# Patient Record
Sex: Female | Born: 1970 | Race: White | Hispanic: No | Marital: Single | Smoking: Never smoker
Health system: Southern US, Community
[De-identification: ages and names within clinical notes are randomized; demographics above are authoritative.]

## PROBLEM LIST (undated history)

## (undated) DIAGNOSIS — N3010 Interstitial cystitis (chronic) without hematuria: Secondary | ICD-10-CM

## (undated) DIAGNOSIS — F32A Depression, unspecified: Secondary | ICD-10-CM

## (undated) DIAGNOSIS — G8929 Other chronic pain: Secondary | ICD-10-CM

## (undated) DIAGNOSIS — C50212 Malignant neoplasm of upper-inner quadrant of left female breast: Principal | ICD-10-CM

## (undated) DIAGNOSIS — K594 Anal spasm: Secondary | ICD-10-CM

## (undated) DIAGNOSIS — M25512 Pain in left shoulder: Secondary | ICD-10-CM

## (undated) DIAGNOSIS — F329 Major depressive disorder, single episode, unspecified: Secondary | ICD-10-CM

## (undated) DIAGNOSIS — K649 Unspecified hemorrhoids: Secondary | ICD-10-CM

## (undated) DIAGNOSIS — Z803 Family history of malignant neoplasm of breast: Secondary | ICD-10-CM

## (undated) DIAGNOSIS — D649 Anemia, unspecified: Secondary | ICD-10-CM

## (undated) DIAGNOSIS — K5900 Constipation, unspecified: Secondary | ICD-10-CM

## (undated) DIAGNOSIS — K589 Irritable bowel syndrome without diarrhea: Secondary | ICD-10-CM

## (undated) DIAGNOSIS — F419 Anxiety disorder, unspecified: Secondary | ICD-10-CM

## (undated) DIAGNOSIS — G709 Myoneural disorder, unspecified: Secondary | ICD-10-CM

## (undated) HISTORY — PX: TONSILLECTOMY: SUR1361

## (undated) HISTORY — DX: Interstitial cystitis (chronic) without hematuria: N30.10

## (undated) HISTORY — PX: WISDOM TOOTH EXTRACTION: SHX21

## (undated) HISTORY — DX: Anxiety disorder, unspecified: F41.9

## (undated) HISTORY — DX: Irritable bowel syndrome, unspecified: K58.9

## (undated) HISTORY — DX: Unspecified hemorrhoids: K64.9

## (undated) HISTORY — DX: Constipation, unspecified: K59.00

## (undated) HISTORY — DX: Family history of malignant neoplasm of breast: Z80.3

## (undated) HISTORY — DX: Anal spasm: K59.4

## (undated) HISTORY — DX: Malignant neoplasm of upper-inner quadrant of left female breast: C50.212

---

## 1999-05-29 ENCOUNTER — Inpatient Hospital Stay (HOSPITAL_COMMUNITY): Admission: AD | Admit: 1999-05-29 | Discharge: 1999-05-29 | Payer: Self-pay | Admitting: Obstetrics & Gynecology

## 1999-10-14 ENCOUNTER — Inpatient Hospital Stay (HOSPITAL_COMMUNITY): Admission: AD | Admit: 1999-10-14 | Discharge: 1999-10-15 | Payer: Self-pay | Admitting: *Deleted

## 1999-10-16 ENCOUNTER — Encounter: Admission: RE | Admit: 1999-10-16 | Discharge: 1999-11-18 | Payer: Self-pay | Admitting: Obstetrics and Gynecology

## 2000-08-22 ENCOUNTER — Encounter: Admission: RE | Admit: 2000-08-22 | Discharge: 2000-09-18 | Payer: Self-pay | Admitting: Obstetrics and Gynecology

## 2002-04-30 ENCOUNTER — Other Ambulatory Visit: Admission: RE | Admit: 2002-04-30 | Discharge: 2002-04-30 | Payer: Self-pay | Admitting: Obstetrics and Gynecology

## 2003-08-11 ENCOUNTER — Other Ambulatory Visit: Admission: RE | Admit: 2003-08-11 | Discharge: 2003-08-11 | Payer: Self-pay | Admitting: Obstetrics and Gynecology

## 2004-01-21 ENCOUNTER — Emergency Department (HOSPITAL_COMMUNITY): Admission: EM | Admit: 2004-01-21 | Discharge: 2004-01-21 | Payer: Self-pay | Admitting: Family Medicine

## 2005-08-30 ENCOUNTER — Other Ambulatory Visit: Admission: RE | Admit: 2005-08-30 | Discharge: 2005-08-30 | Payer: Self-pay | Admitting: Obstetrics and Gynecology

## 2005-09-14 ENCOUNTER — Encounter: Admission: RE | Admit: 2005-09-14 | Discharge: 2005-09-14 | Payer: Self-pay | Admitting: Obstetrics and Gynecology

## 2012-08-10 ENCOUNTER — Ambulatory Visit (INDEPENDENT_AMBULATORY_CARE_PROVIDER_SITE_OTHER): Payer: BC Managed Care – PPO | Admitting: Family Medicine

## 2012-08-10 VITALS — BP 80/64 | HR 77 | Temp 98.3°F | Resp 18 | Wt 133.0 lb

## 2012-08-10 DIAGNOSIS — N390 Urinary tract infection, site not specified: Secondary | ICD-10-CM

## 2012-08-10 LAB — POCT URINALYSIS DIPSTICK
Bilirubin, UA: NEGATIVE
Ketones, UA: NEGATIVE
Nitrite, UA: NEGATIVE
Protein, UA: NEGATIVE

## 2012-08-10 LAB — POCT UA - MICROSCOPIC ONLY
Mucus, UA: NEGATIVE
Yeast, UA: NEGATIVE

## 2012-08-10 MED ORDER — NITROFURANTOIN MONOHYD MACRO 100 MG PO CAPS: 100.0000 mg | ORAL_CAPSULE | Freq: Two times a day (BID) | ORAL | Status: DC

## 2012-08-10 NOTE — Patient Instructions (Signed)
To look up more info on your condition, go to the website [email or web address], then on patient resources - select UPTODATE. Under patient resources, select cystitis or bladder infection.   Urinary Tract Infection Urinary tract infections (UTIs) can develop anywhere along your urinary tract. Your urinary tract is your body's drainage system for removing wastes and extra water. Your urinary tract includes two kidneys, two ureters, a bladder, and a urethra. Your kidneys are a pair of bean-shaped organs. Each kidney is about the size of your fist. They are located below your ribs, one on each side of your spine. CAUSES Infections are caused by microbes, which are microscopic organisms, including fungi, viruses, and bacteria. These organisms are so small that they can only be seen through a microscope. Bacteria are the microbes that most commonly cause UTIs. SYMPTOMS  Symptoms of UTIs may vary by age and gender of the patient and by the location of the infection. Symptoms in young women typically include a frequent and intense urge to urinate and a painful, burning feeling in the bladder or urethra during urination. Older women and men are more likely to be tired, shaky, and weak and have muscle aches and abdominal pain. A fever may mean the infection is in your kidneys. Other symptoms of a kidney infection include pain in your back or sides below the ribs, nausea, and vomiting. DIAGNOSIS To diagnose a UTI, your caregiver will ask you about your symptoms. Your caregiver also will ask to provide a urine sample. The urine sample will be tested for bacteria and white blood cells. White blood cells are made by your body to help fight infection. TREATMENT  Typically, UTIs can be treated with medication. Because most UTIs are caused by a bacterial infection, they usually can be treated with the use of antibiotics. The choice of antibiotic and length of treatment depend on your symptoms and the type of bacteria causing  your infection. HOME CARE INSTRUCTIONS  If you were prescribed antibiotics, take them exactly as your caregiver instructs you. Finish the medication even if you feel better after you have only taken some of the medication.  Drink enough water and fluids to keep your urine clear or pale yellow.  Avoid caffeine, tea, and carbonated beverages. They tend to irritate your bladder.  Empty your bladder often. Avoid holding urine for long periods of time.  Empty your bladder before and after sexual intercourse.  After a bowel movement, women should cleanse from front to back. Use each tissue only once. SEEK MEDICAL CARE IF:   You have back pain.  You develop a fever.  Your symptoms do not begin to resolve within 3 days. SEEK IMMEDIATE MEDICAL CARE IF:   You have severe back pain or lower abdominal pain.  You develop chills.  You have nausea or vomiting.  You have continued burning or discomfort with urination. MAKE SURE YOU:   Understand these instructions.  Will watch your condition.  Will get help right away if you are not doing well or get worse. Document Released: 03/22/2005 Document Revised: 12/12/2011 Document Reviewed: 07/21/2011 [facility] Patient Information 2013 [place], [place].

## 2012-08-10 NOTE — Progress Notes (Signed)
Subjective:    Patient ID: Gabriela [name], female    DOB: August 26, 1970, 42 y.o.   MRN: [id number]  HPI Gabriela [name] is a 42 y.o. female Started last night with dysuria.   Home test showed infection. Tried baking soda remedy online - 1/2  teaspoon in 8 ounce of water.   No fever, no N/V, no back pain.  Slight bladder discomfort.  Last UTI years ago.   LNMP: 1 month ago.   Review of Systems  Constitutional: Negative for fever and chills.  Genitourinary: Positive for dysuria, urgency, frequency and difficulty urinating. Negative for hematuria and flank pain.       Objective:   Physical Exam  Vitals reviewed. Constitutional: She is oriented to person, place, and time. She appears well-developed and well-nourished.  HENT:  Head: Normocephalic and atraumatic.  Pulmonary/Chest: Effort normal.  Abdominal: Soft. Normal appearance. She exhibits no distension. There is tenderness in the suprapubic area. There is no rebound, no guarding and no CVA tenderness.  Neurological: She is alert and oriented to person, place, and time.  Skin: Skin is warm.  Psychiatric: She has a normal mood and affect. Her behavior is normal.   Results for orders placed in visit on 08/10/12  POCT UA - MICROSCOPIC ONLY      Result Value Range   WBC, Ur, HPF, POC tntc     RBC, urine, microscopic 1-4     Bacteria, U Microscopic 1+     Mucus, UA neg     Epithelial cells, urine per micros 0-3     Crystals, Ur, HPF, POC neg     Casts, Ur, LPF, POC neg     Yeast, UA neg    POCT URINALYSIS DIPSTICK      Result Value Range   Color, UA yellow     Clarity, UA clear     Glucose, UA neg     Bilirubin, UA neg     Ketones, UA neg     Spec Grav, UA 1.015     Blood, UA neg     pH, UA 7.5     Protein, UA neg     Urobilinogen, UA 0.2     Nitrite, UA neg     Leukocytes, UA Trace         Assessment & Plan:  Gabriela [name] is a 42 y.o. female Urinary tract infection, site not specified - Plan:  POCT UA - Microscopic Only, POCT urinalysis dipstick, nitrofurantoin, macrocrystal-monohydrate, (MACROBID) 100 MG capsule, Urine culture discussed results, plan and choice of antibiotic. All questions answered.  Push fluids, rtc precautions.   Patient Instructions  To look up more info on your condition, go to the website [email or web address], then on patient resources - select UPTODATE. Under patient resources, select cystitis or bladder infection.   Urinary Tract Infection Urinary tract infections (UTIs) can develop anywhere along your urinary tract. Your urinary tract is your body's drainage system for removing wastes and extra water. Your urinary tract includes two kidneys, two ureters, a bladder, and a urethra. Your kidneys are a pair of bean-shaped organs. Each kidney is about the size of your fist. They are located below your ribs, one on each side of your spine. CAUSES Infections are caused by microbes, which are microscopic organisms, including fungi, viruses, and bacteria. These organisms are so small that they can only be seen through a microscope. Bacteria are the microbes that most commonly cause UTIs. SYMPTOMS  Symptoms of UTIs may vary  by age and gender of the patient and by the location of the infection. Symptoms in young women typically include a frequent and intense urge to urinate and a painful, burning feeling in the bladder or urethra during urination. Older women and men are more likely to be tired, shaky, and weak and have muscle aches and abdominal pain. A fever may mean the infection is in your kidneys. Other symptoms of a kidney infection include pain in your back or sides below the ribs, nausea, and vomiting. DIAGNOSIS To diagnose a UTI, your caregiver will ask you about your symptoms. Your caregiver also will ask to provide a urine sample. The urine sample will be tested for bacteria and white blood cells. White blood cells are made by your body to help fight infection. TREATMENT    Typically, UTIs can be treated with medication. Because most UTIs are caused by a bacterial infection, they usually can be treated with the use of antibiotics. The choice of antibiotic and length of treatment depend on your symptoms and the type of bacteria causing your infection. HOME CARE INSTRUCTIONS  If you were prescribed antibiotics, take them exactly as your caregiver instructs you. Finish the medication even if you feel better after you have only taken some of the medication.  Drink enough water and fluids to keep your urine clear or pale yellow.  Avoid caffeine, tea, and carbonated beverages. They tend to irritate your bladder.  Empty your bladder often. Avoid holding urine for long periods of time.  Empty your bladder before and after sexual intercourse.  After a bowel movement, women should cleanse from front to back. Use each tissue only once. SEEK MEDICAL CARE IF:   You have back pain.  You develop a fever.  Your symptoms do not begin to resolve within 3 days. SEEK IMMEDIATE MEDICAL CARE IF:   You have severe back pain or lower abdominal pain.  You develop chills.  You have nausea or vomiting.  You have continued burning or discomfort with urination. MAKE SURE YOU:   Understand these instructions.  Will watch your condition.  Will get help right away if you are not doing well or get worse. Document Released: 03/22/2005 Document Revised: 12/12/2011 Document Reviewed: 07/21/2011 [facility] Patient Information 2013 [place], [place].

## 2012-08-20 ENCOUNTER — Encounter: Payer: Self-pay | Admitting: *Deleted

## 2012-11-04 ENCOUNTER — Ambulatory Visit (INDEPENDENT_AMBULATORY_CARE_PROVIDER_SITE_OTHER): Payer: BC Managed Care – PPO | Admitting: General Surgery

## 2012-11-04 ENCOUNTER — Encounter (INDEPENDENT_AMBULATORY_CARE_PROVIDER_SITE_OTHER): Payer: Self-pay | Admitting: General Surgery

## 2012-11-04 ENCOUNTER — Encounter (INDEPENDENT_AMBULATORY_CARE_PROVIDER_SITE_OTHER): Payer: BC Managed Care – PPO | Admitting: General Surgery

## 2012-11-04 VITALS — BP 92/56 | HR 68 | Temp 98.7°F | Resp 12 | Ht 70.0 in | Wt 119.8 lb

## 2012-11-04 DIAGNOSIS — K644 Residual hemorrhoidal skin tags: Secondary | ICD-10-CM

## 2012-11-04 NOTE — Addendum Note (Signed)
Addended by: June Leap on: 11/04/2012 03:21 PM   Modules accepted: Orders

## 2012-11-04 NOTE — Progress Notes (Signed)
Subjective:     Patient ID: Gabriela [name], female   DOB: Oct 29, 1970, 42 y.o.   MRN: [id number]  HPI The patient is a 42 year old female who states she had a two-week history of hemorrhoids after eating a large meat containing diet. The patient states she had a large bowel movement, and this began the hemorrhoid issue. She states that it might have been there since having her children. She does have the pain is currently improving, and has been using Preparation H. She does state she has some mucousy discharge that began at the time of her hemorrhoid issue.  Review of Systems  Constitutional: Negative.   HENT: Negative.   Respiratory: Negative.   Cardiovascular: Negative.   Gastrointestinal: Positive for anal bleeding.  Genitourinary: Negative.   Neurological: Negative.        Objective:   Physical Exam  Constitutional: She is oriented to person, place, and time. She appears well-developed and well-nourished.  HENT:  Head: Normocephalic and atraumatic.  Eyes: Conjunctivae and EOM are normal.  Neck: Normal range of motion. Neck supple.  Cardiovascular: Normal rate, regular rhythm and normal heart sounds.   Pulmonary/Chest: Effort normal and breath sounds normal.  Abdominal: Soft. Bowel sounds are normal.  Genitourinary:     Musculoskeletal: Normal range of motion.  Neurological: She is alert and oriented to person, place, and time.       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     Plan:     1. We discussed the pathophysiology of hemorrhoids, and the importance of a large amount of fiber and hydrated. The patient states she has been on a Vegan diet and wishes to continue doing so. I encouraged her to increase her hydration.  2. I do nothing at this time the hemorrhoids to be excised. The patient does wish for some pain medication, however does not feel that she needs it currently.  3.The patient will continue with pain and has not  resolved, she can return for a followup visit, OS should return when necessary

## 2013-08-19 ENCOUNTER — Ambulatory Visit (INDEPENDENT_AMBULATORY_CARE_PROVIDER_SITE_OTHER): Payer: BC Managed Care – PPO | Admitting: Physician Assistant

## 2013-08-19 VITALS — BP 104/60 | HR 58 | Temp 98.0°F | Resp 16 | Ht 68.5 in | Wt 121.8 lb

## 2013-08-19 DIAGNOSIS — R05 Cough: Secondary | ICD-10-CM

## 2013-08-19 DIAGNOSIS — J329 Chronic sinusitis, unspecified: Secondary | ICD-10-CM

## 2013-08-19 DIAGNOSIS — R0981 Nasal congestion: Secondary | ICD-10-CM

## 2013-08-19 DIAGNOSIS — R059 Cough, unspecified: Secondary | ICD-10-CM

## 2013-08-19 DIAGNOSIS — J3489 Other specified disorders of nose and nasal sinuses: Secondary | ICD-10-CM

## 2013-08-19 MED ORDER — AMOXICILLIN 875 MG PO TABS: 875.0000 mg | ORAL_TABLET | Freq: Two times a day (BID) | ORAL | Status: DC

## 2013-08-19 MED ORDER — BENZONATATE 100 MG PO CAPS: 100.0000 mg | ORAL_CAPSULE | Freq: Three times a day (TID) | ORAL | Status: DC | PRN

## 2013-08-19 MED ORDER — IPRATROPIUM BROMIDE 0.03 % NA SOLN: 2.0000 | Freq: Two times a day (BID) | NASAL | Status: DC

## 2013-08-19 NOTE — Progress Notes (Signed)
   Subjective:    Patient ID: [name], female    DOB: [birth year], 43 y.o.   MRN: [id number]  HPI 43 year old female presents for evaluation of 1 week history of nasal congestion, rhinorrhea, PND, cough, and fatigue.  Admits that she had an illness at Christmas time that lasted for 2 weeks. Was never treated for this but states symptoms did improve.  She never got completely well and then 5 days ago symptoms got worse again. Has had a lot of coughing and copious amounts of nasal drainage. Denies SOB, wheezing, chest pain, nausea, vomiting, or abdominal pain. Does admit to some sinus pain/pressure.  Patient is otherwise doing well today with no other concerns.     Review of Systems  Constitutional: Negative for fever and chills.  HENT: Positive for congestion, postnasal drip, rhinorrhea and sinus pressure. Negative for ear pain and sore throat.   Respiratory: Positive for cough. Negative for chest tightness, shortness of breath and wheezing.   Cardiovascular: Negative for chest pain.  Gastrointestinal: Negative for nausea, vomiting and abdominal pain.  Neurological: Negative for dizziness and headaches.       Objective:   Physical Exam  Constitutional: She is oriented to person, place, and time. She appears well-developed and well-nourished.  HENT:  Head: Normocephalic and atraumatic.  Right Ear: Hearing, tympanic membrane, external ear and ear canal normal.  Left Ear: Hearing, tympanic membrane, external ear and ear canal normal.  Nose: Right sinus exhibits frontal sinus tenderness. Right sinus exhibits no maxillary sinus tenderness. Left sinus exhibits frontal sinus tenderness. Left sinus exhibits no maxillary sinus tenderness.  Mouth/Throat: Uvula is midline, oropharynx is clear and moist and mucous membranes are normal.  Eyes: Conjunctivae are normal.  Neck: Normal range of motion. Neck supple.  Cardiovascular: Normal rate, regular rhythm and normal heart sounds.     Pulmonary/Chest: Effort normal and breath sounds normal.  Lymphadenopathy:    She has no cervical adenopathy.  Neurological: She is alert and oriented to person, place, and time.  Psychiatric: She has a normal mood and affect. Her behavior is normal. Judgment and thought content normal.          Assessment & Plan:  Sinusitis - Plan: amoxicillin (AMOXIL) 875 MG tablet  Cough - Plan: benzonatate (TESSALON) 100 MG capsule  Nasal congestion - Plan: ipratropium (ATROVENT) 0.03 % nasal spray  Patient declined labwork today.  Was very resistant to treatment plan and work up.   Plan to treat for sinusitis with amoxicillin 875 mg bid x 10 days Atrovent NS twice daily to help with congestion and PND.  Tessalon perles tid prn cough. Patient declined Hycodan today.  Increase fluids and rest Out of work tomorrow. RTC precautions discussed.  Follow up if symptoms worsening or fail to improve.

## 2014-10-30 ENCOUNTER — Ambulatory Visit (INDEPENDENT_AMBULATORY_CARE_PROVIDER_SITE_OTHER): Payer: Self-pay | Admitting: Physician Assistant

## 2014-10-30 VITALS — BP 100/60 | HR 55 | Temp 98.5°F | Resp 16 | Ht 68.0 in | Wt 125.0 lb

## 2014-10-30 DIAGNOSIS — B001 Herpesviral vesicular dermatitis: Secondary | ICD-10-CM

## 2014-10-30 DIAGNOSIS — K130 Diseases of lips: Secondary | ICD-10-CM

## 2014-10-30 MED ORDER — TRIAMCINOLONE ACETONIDE 0.1 % EX LOTN: 1.0000 "application " | TOPICAL_LOTION | Freq: Two times a day (BID) | CUTANEOUS | Status: DC

## 2014-10-30 MED ORDER — VALACYCLOVIR HCL 1 G PO TABS: 2000.0000 mg | ORAL_TABLET | Freq: Two times a day (BID) | ORAL | Status: DC

## 2014-10-30 NOTE — Patient Instructions (Addendum)
You may have had a cold sore initially that most likely became something called angular cheilitis.  Please apply the steroid topical twice daily for 1-2 weeks. Please apply petroleum jelly to the area after steroid application. When you have herpes coming on in the future, please take the valacyclovir 2000 mg at one time, then repeat 12 hours later.  This should help to stop any herpes outbreaks from being bad.

## 2014-10-30 NOTE — Progress Notes (Signed)
   Subjective:    Patient ID: Gabriela [name], female    DOB: Oct 23, 1970, 44 y.o.   MRN: [id number]   Chief Complaint  Patient presents with  . Mouth Lesions    x 1 month   There are no active problems to display for this patient.  Medications, allergies, past medical history, surgical history, family history, social history and problem [name] reviewed and updated.  HPI  44 yof with pmh recurrent oral herpes outbreaks presents with outbreak ongoing for one month.   Hx cold sores for many yrs. Typically resolves with otc Lysine. This episode has lasted one month right corner of mouth. Burning and itching. Has been avoiding touching it or licking it. Denies lesions inside mouth or st.   She feels this is her typical herpes outbreak but has gone on much longer than normal. Denies recent infx.   Review of Systems No fevers, chills.     Objective:   Physical Exam  Constitutional: She is oriented to person, place, and time. She appears well-developed and well-nourished.  Non-toxic appearance. She does not have a sickly appearance. She does not appear ill. No distress.  BP 100/60 mmHg  Pulse 55  Temp(Src) 98.5 F (36.9 C) (Oral)  Resp 16  Ht 5\' 8"  (1.727 m)  Wt 125 lb (56.7 kg)  BMI 19.01 kg/m2  SpO2 99%  LMP 10/15/2014   HENT:  Mouth/Throat: Uvula is midline and oropharynx is clear and moist.  Erythema and fissuring right vermilion border.   Neurological: She is alert and oriented to person, place, and time.      Assessment & Plan:   44 yof with pmh recurrent oral herpes outbreaks presents with outbreak ongoing for one month.   Angular cheilitis - Plan: triamcinolone lotion (KENALOG) 0.1 % Recurrent cold sores - Plan: valACYclovir (VALTREX) 1000 MG tablet --right vermilion border lesion likely angular cheilitis as has been ongoing for one month --triamcinolone topical bid 1-2 wks --may have started with cold sore --valacyclovir for recurrent prodromal herpes outbreaks in  future  Gabriela Gutting, PA-C Physician Assistant-Certified [facility]  10/30/2014 4:05 PM

## 2014-11-10 ENCOUNTER — Telehealth: Payer: Self-pay

## 2014-11-10 ENCOUNTER — Telehealth: Payer: Self-pay | Admitting: Radiology

## 2014-11-10 DIAGNOSIS — B001 Herpesviral vesicular dermatitis: Secondary | ICD-10-CM

## 2014-11-10 NOTE — Telephone Encounter (Signed)
Patient is calling because she picked up her valacyclovir medication and noticed that no refills were sent. Patient states that she will be out of town for 2 weeks and would like refills sent to the pharmacy. Please call! [phone]

## 2014-11-10 NOTE — Telephone Encounter (Signed)
Sherren Mocha, [facility] called. They were concerned about pt using triamcinolone cream near her mouth. They said that they had a paste they could make that would be safer. I told them that it was ok to substitute.  FYI From [employer], [place]

## 2014-11-11 MED ORDER — VALACYCLOVIR HCL 1 G PO TABS: 2000.0000 mg | ORAL_TABLET | Freq: Two times a day (BID) | ORAL | Status: DC

## 2014-11-11 NOTE — Telephone Encounter (Signed)
Sent in 20 more pills with 2 refills.

## 2014-11-11 NOTE — Telephone Encounter (Signed)
Spoke with pt, advised Rx sent.

## 2014-11-11 NOTE — Telephone Encounter (Signed)
Was there a reason why there were no refills?

## 2015-06-27 HISTORY — PX: BREAST SURGERY: SHX581

## 2015-08-19 ENCOUNTER — Other Ambulatory Visit: Payer: Self-pay | Admitting: Obstetrics and Gynecology

## 2015-08-19 DIAGNOSIS — N6320 Unspecified lump in the left breast, unspecified quadrant: Secondary | ICD-10-CM

## 2015-08-24 ENCOUNTER — Ambulatory Visit
Admission: RE | Admit: 2015-08-24 | Discharge: 2015-08-24 | Disposition: A | Discharge status: Home / Self Care | Payer: No Typology Code available for payment source | Source: Ambulatory Visit | Attending: Obstetrics and Gynecology | Admitting: Obstetrics and Gynecology

## 2015-08-24 DIAGNOSIS — N6320 Unspecified lump in the left breast, unspecified quadrant: Secondary | ICD-10-CM

## 2015-08-25 ENCOUNTER — Telehealth (HOSPITAL_COMMUNITY): Payer: Self-pay | Admitting: *Deleted

## 2015-08-25 ENCOUNTER — Other Ambulatory Visit: Payer: Self-pay | Admitting: Obstetrics and Gynecology

## 2015-08-25 DIAGNOSIS — N63 Unspecified lump in unspecified breast: Secondary | ICD-10-CM

## 2015-08-25 NOTE — Telephone Encounter (Signed)
Telephoned patient at home # and mailbox is full and cannot leave message.

## 2015-08-31 ENCOUNTER — Ambulatory Visit
Admission: RE | Admit: 2015-08-31 | Discharge: 2015-08-31 | Disposition: A | Discharge status: Home / Self Care | Payer: No Typology Code available for payment source | Source: Ambulatory Visit | Attending: Obstetrics and Gynecology | Admitting: Obstetrics and Gynecology

## 2015-08-31 DIAGNOSIS — N63 Unspecified lump in unspecified breast: Secondary | ICD-10-CM

## 2015-09-06 ENCOUNTER — Other Ambulatory Visit: Payer: No Typology Code available for payment source

## 2015-09-06 ENCOUNTER — Ambulatory Visit
Admission: RE | Admit: 2015-09-06 | Discharge: 2015-09-06 | Disposition: A | Discharge status: Home / Self Care | Payer: No Typology Code available for payment source | Source: Ambulatory Visit | Attending: Obstetrics and Gynecology | Admitting: Obstetrics and Gynecology

## 2015-09-06 DIAGNOSIS — N63 Unspecified lump in unspecified breast: Secondary | ICD-10-CM

## 2015-09-07 ENCOUNTER — Telehealth: Payer: Self-pay | Admitting: *Deleted

## 2015-09-07 NOTE — Telephone Encounter (Signed)
Called pt to discuss [facility] on 09/15/15. No answer and unable to leave vm d/t it was full. Will call again.

## 2015-09-09 ENCOUNTER — Encounter: Payer: Self-pay | Admitting: *Deleted

## 2015-09-09 ENCOUNTER — Telehealth: Payer: Self-pay | Admitting: *Deleted

## 2015-09-09 DIAGNOSIS — C50212 Malignant neoplasm of upper-inner quadrant of left female breast: Secondary | ICD-10-CM

## 2015-09-09 DIAGNOSIS — Z853 Personal history of malignant neoplasm of breast: Secondary | ICD-10-CM | POA: Insufficient documentation

## 2015-09-09 HISTORY — DX: Malignant neoplasm of upper-inner quadrant of left female breast: C50.212

## 2015-09-09 NOTE — Telephone Encounter (Signed)
Confirmed [facility] for 09/15/15 at 0815.  Instructions and contact information given.

## 2015-09-09 NOTE — Telephone Encounter (Signed)
Mailed clinic packet to pt.  

## 2015-09-14 NOTE — Progress Notes (Signed)
[place]  [phone] Fax:([phone] [place] Note   Patient Care Team: Carlena Sax, MD as PCP - General (Internal Medicine) 09/15/2015  CHIEF COMPLAINTS/PURPOSE OF CONSULTATION:  Newly diagnosed left breast cancer  Oncology History   Breast cancer of upper-inner quadrant of left female breast [facility])   Staging form: Breast, AJCC 7th Edition     Clinical stage from 09/06/2015: Stage IA (T1c, N0, M0) - Signed by Truitt Merle, MD on 09/14/2015       Breast cancer of upper-inner quadrant of left female breast ([place])   08/31/2015 Imaging Pt declined mammogram, [place] of left breast showed a 1.6 x 1 x 1.1 cm hypoechoic mass within the left breast at the 10:00 position. 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   09/06/2015 Receptors her2 ER 100% positive, PR 100% positive, HER-2 negative, Ki-67 10%    HISTORY OF PRESENTING ILLNESS:  Gabriela [name] 45 y.o. female is here because of Her newly diagnosed breast cancer. She is accompanied by her husband to our multidisciplinary clinic today.  She palpated a mass in left breast 2 months ago, no tenderness, skin or nipple change, also noticed intermittent palpitation, no chest paijn , dyspnea or other symptoms, she has good appetite and energy level, no weight loss. She was referred to [facility] him a and underwent a targeted ultrasound of left breast, which showed a 1.6 cm hypoechoic mass within the left breast at the 10 clock position, axillar was negative. She declined mammogram. She underwent ultrasound-guided left breast mass biopsy, which showed invasive ductal carcinoma, ER/PR positive, HER-2 negative.  Her mother died of recurrent metastatic breast cancer. She has  decided to proceed with bilateral mastectomy.  GYN HISTORY  Menarchal: 12 LMP: 08/25/2015  Contraceptive: no  HRT: n/a  G2P2: 43 and 52 yo sons     MEDICAL HISTORY:  Past Medical History  Diagnosis Date  . Breast cancer of upper-inner quadrant of left female breast ([place]) 09/09/2015    SURGICAL HISTORY: No past surgical history on file.  SOCIAL HISTORY: Social History   Social History  . Marital Status: Married    Spouse Name: N/A  . Number of Children: N/A  . Years of Education: N/A   Occupational History  . Not on file.   Social History Main Topics  . Smoking status: Former Research scientist (life sciences)  . Smokeless tobacco: Not on file  . Alcohol Use: 1.2 oz/week    2 Glasses of wine per week  . Drug Use: Not on file  . Sexual Activity: Not on file   Other Topics Concern  . 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    FAMILY HISTORY: Family History  Problem Relation Age of Onset  . Cancer Mother     ALLERGIES:  is allergic to sulfa antibiotics.  MEDICATIONS:  Current Outpatient Prescriptions  Medication Sig Dispense Refill  . b complex vitamins tablet Take 1 tablet by mouth daily.    . cholecalciferol (VITAMIN D) 1000 units tablet Take 2,000 Units by mouth daily.    Marland Kitchen Lysine 1000 MG TABS Take 1 tablet by mouth as needed.    . Omega-3  Fatty Acids (OMEGA 3 PO) Take 2 tablets by mouth daily.     No current facility-administered medications for this visit.    REVIEW OF SYSTEMS:   Constitutional: Denies fevers, chills or abnormal night sweats Eyes: Denies blurriness of vision, double vision or watery eyes Ears, nose, mouth, throat, and face: Denies mucositis or sore throat Respiratory: Denies cough, dyspnea or wheezes Cardiovascular: Denies palpitation, chest discomfort or lower extremity swelling Gastrointestinal:  Denies nausea, heartburn or change in bowel habits Skin:  Denies abnormal skin rashes Lymphatics: Denies new lymphadenopathy or easy bruising Neurological:Denies numbness, tingling or new weaknesses Behavioral/Psych: Mood is stable, no new changes  All other systems were reviewed with the patient and are negative.  PHYSICAL EXAMINATION: ECOG PERFORMANCE STATUS: 0 - Asymptomatic  Filed Vitals:   09/15/15 0928  BP: 94/50  Pulse: 66  Temp: 98.1 F (36.7 C)  Resp: 19   Filed Weights   09/15/15 0928  Weight: 125 lb 9.6 oz (56.972 kg)    GENERAL:alert, no distress and comfortable SKIN: skin color, texture, turgor are normal, no rashes or significant lesions EYES: normal, conjunctiva are pink and non-injected, sclera clear OROPHARYNX:no exudate, no erythema and lips, buccal mucosa, and tongue normal  NECK: supple, thyroid normal size, non-tender, without nodularity LYMPH:  no palpable lymphadenopathy in the cervical, axillary or inguinal LUNGS: clear to auscultation and percussion with normal breathing effort HEART: regular rate & rhythm and no murmurs and no lower extremity edema ABDOMEN:abdomen soft, non-tender and normal bowel sounds Musculoskeletal:no cyanosis of digits and no clubbing  PSYCH: alert & oriented x 3 with fluent speech NEURO: no focal motor/sensory deficits Breasts: Breast inspection showed them to be symmetrical with no nipple discharge. Palpation of the breasts and axilla revealed a 2X2cm mass in the upper outer quadrant of left breast, nontender, no skin change, no other obvious mass that I could appreciate.   LABORATORY DATA:  I have reviewed the data as listed Lab Results  Component Value Date   WBC 5.8 09/15/2015   HGB 13.3 09/15/2015   HCT 40.3 09/15/2015   MCV 91.2 09/15/2015   PLT 236 09/15/2015    Recent Labs  09/15/15 0917  NA 141  K 3.7  CO2 27  GLUCOSE 77  BUN 14.2  CREATININE 0.9  CALCIUM 9.4  PROT 7.5  ALBUMIN 4.3  AST 19  ALT 10  ALKPHOS 53  BILITOT 0.81    PATHOLOGY REPORT    Diagnosis 09/06/2015   Breast, left, needle core biopsy, 10:00 o'clock - INVASIVE DUCTAL CARCINOMA. - DUCTAL CARCINOMA IN SITU WITH ASSOCIATED CALCIFICATIONS. - SEE COMMENT. Microscopic Comment Although definitive grading of breast carcinoma is best done on excision, the features of the invasive tumor from the left 10 o'clock needle core biopsy are compatible with a grade 1-2 breast carcinoma. Breast prognostic markers will be performed and reported in an addendum. Findings are called to the [facility] on 09/07/15. Dr. Vicente Males has seen this case in consultation with agreement. (RAH:gt, 09/07/15) PROGNOSTIC INDICATORS Results: IMMUNOHISTOCHEMICAL AND MORPHOMETRIC ANALYSIS PERFORMED MANUALLY Estrogen Receptor: 100%, POSITIVE, MODERATE STAINING INTENSITY Progesterone Receptor: 100%, POSITIVE, STRONG STAINING INTENSITY Proliferation Marker Ki67: 10% Results: HER2 - NEGATIVE RATIO OF HER2/CEP17 SIGNALS 1.43 AVERAGE HER2 COPY NUMBER PER CELL 1.82  1.6X1X RADIOGRAPHIC STUDIES: I have personally reviewed the radiological images as listed and agreed with the findings in the report.  US Breast Ltd Uni Left Inc Axilla 08/31/2015  ADDENDUM REPORT: 08/31/2015 10:00 ADDENDUM: The patient presented for  ultrasound guided biopsy of the left breast today. She felt anxious about the procedure and was hesitant about placing the biopsy marking clip at the end of the procedure. The purpose of the clip was explained to the patient, and the clip was shown to the patient. Ultimately she agreed to the clip, but would like to reschedule to allow her to take Ativan prior to the procedure. She will be rescheduled for Monday 09/06/2015 at 11 am. I advised her to arrive 45 minutes prior to the procedure so that she can be consented prior to taking the Ativan, and advised her that she will need a driver to accompany her. Today, the left axilla was imaged by ultrasound demonstrating multiple normal  appearing lymph nodes. Electronically Signed   By: Ammie Ferrier M.D.   On: 08/31/2015 10:00  08/25/2015  ADDENDUM REPORT: 08/25/2015 07:55 ADDENDUM: Both prior to the exam and during the exam, there was extensive conversation about the need for mammography in order to fully evaluate this palpable abnormality and to exclude synchronous lesion. Patient repeatedly declined mammography. Patient indicated an understanding that performing an ultrasound only is not standard of care and may limit her evaluation. Electronically Signed   By: Franki Cabot M.D.   On: 08/25/2015 07:55  08/31/2015  CLINICAL DATA:  Patient presents with a palpable abnormality in the upper left breast. Family history of breast cancer (mother). Patient declines mammogram. EXAM: ULTRASOUND OF THE LEFT BREAST COMPARISON:  No prior exams. FINDINGS: On physical exam, there is a palpable mass within the upper left breast. Targeted ultrasound is performed, showing an irregular hypoechoic mass within the left breast at the 10 o'clock axis, 5 cm from the nipple, with some internal vascularity, measuring 1.6 x 1 x 1.1 cm, corresponding to the palpable abnormality. IMPRESSION: Irregular hypoechoic mass within the left breast at the 10 o'clock axis, 5 cm from the nipple, measuring 1.6 x 1 x 1.1 cm, corresponding to a palpable abnormality. This is a highly suspicious finding for which ultrasound-guided biopsy is recommended. During the ultrasound exam, mammography was again recommended to the patient for more definitive characterization of the left breast mass and to exclude synchronous lesions. Patient again declined mammography. RECOMMENDATION: Ultrasound-guided biopsy of the left breast mass. Patient will meet with BCCCP prior to scheduling the procedure. Axillary ultrasound was not performed today. Recommend ultrasound evaluation of the left axilla at the time of the ultrasound-guided biopsy. I have discussed the findings and recommendations with the  patient. Results were also provided in writing at the conclusion of the visit. If applicable, a reminder letter will be sent to the patient regarding the next appointment. BI-RADS CATEGORY  4: Suspicious abnormality - biopsy should be considered. Electronically Signed: By: Franki Cabot M.D. On: 08/24/2015 18:07   [place] Lt Breast Bx W Loc Dev 1st Lesion Img Bx Spec US Guide    ASSESSMENT & PLAN: 45 year old premenopausal woman, family history of breast cancer in her mother, presented with a palpable left breast mass.  1. 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 -We strongly recommend her to have bilateral mammogram or MRI to ruled out multifocal disease, but she declined. She has decided to have bilateral mastectomy, due to the concern of recurrent disease, which happens to her mother. -She has met surgeon Dr. Donne Hazel today, surgical options were discussed with her. -I  recommend Oncotype DX test on her surgical sample, to evaluate her risk of recurrence, and to see if she would benefit from adjuvant chemotherapy. -Given her strong ER/PR positivity (100%), premenopausal status, I strongly recommend adjuvant tamoxifen. The other option of ovarian suppression and aromatase inhibitor was discussed with her also. Potential side effects of this antiestrogen therapy were reviewed in great details with her. She is interested. -She was also seen by radiation oncologist Dr. Pablo Ledger today, she may not need adjuvant radiation after mastectomy if she has early-stage disease. -Patient had many questions, I answered to her satisfaction.  2. Genetics -Due to her young age and family history of breast cancer, we will refer her to Dietitian. She agrees.  Plan -Genetic referral -She will proceed with bilateral mastectomy -Oncotype  on her surgical sample if node negative -I'll see her back 3 weeks after surgery to finalize her adjuvant therapy plan.    All questions were answered. The patient knows to call the clinic with any problems, questions or concerns. I spent 55 minutes counseling the patient face to face. The total time spent in the appointment was 60 minutes and more than 50% was on counseling.     Truitt Merle, MD 09/15/2015 10:08 AM

## 2015-09-15 ENCOUNTER — Other Ambulatory Visit: Payer: Self-pay | Admitting: Hematology

## 2015-09-15 ENCOUNTER — Ambulatory Visit (HOSPITAL_BASED_OUTPATIENT_CLINIC_OR_DEPARTMENT_OTHER): Payer: No Typology Code available for payment source | Admitting: Hematology

## 2015-09-15 ENCOUNTER — Ambulatory Visit
Admission: RE | Admit: 2015-09-15 | Discharge: 2015-09-15 | Disposition: A | Discharge status: Home / Self Care | Payer: No Typology Code available for payment source | Source: Ambulatory Visit | Attending: Radiation Oncology | Admitting: Radiation Oncology

## 2015-09-15 ENCOUNTER — Other Ambulatory Visit (HOSPITAL_BASED_OUTPATIENT_CLINIC_OR_DEPARTMENT_OTHER): Payer: No Typology Code available for payment source

## 2015-09-15 ENCOUNTER — Ambulatory Visit
Admission: RE | Admit: 2015-09-15 | Discharge: 2015-09-15 | Disposition: A | Discharge status: Home / Self Care | Payer: No Typology Code available for payment source | Source: Ambulatory Visit | Attending: General Surgery | Admitting: General Surgery

## 2015-09-15 ENCOUNTER — Ambulatory Visit: Payer: No Typology Code available for payment source | Attending: General Surgery | Admitting: Physical Therapy

## 2015-09-15 ENCOUNTER — Other Ambulatory Visit: Payer: Self-pay | Admitting: General Surgery

## 2015-09-15 ENCOUNTER — Encounter: Payer: Self-pay | Admitting: Physical Therapy

## 2015-09-15 ENCOUNTER — Encounter: Payer: Self-pay | Admitting: *Deleted

## 2015-09-15 ENCOUNTER — Other Ambulatory Visit: Payer: Self-pay | Admitting: Obstetrics and Gynecology

## 2015-09-15 ENCOUNTER — Encounter: Payer: Self-pay | Admitting: Hematology

## 2015-09-15 ENCOUNTER — Encounter: Payer: Self-pay | Admitting: Nurse Practitioner

## 2015-09-15 ENCOUNTER — Encounter: Payer: Self-pay | Admitting: Skilled Nursing Facility1

## 2015-09-15 VITALS — BP 94/50 | HR 66 | Temp 98.1°F | Resp 19 | Wt 125.6 lb

## 2015-09-15 DIAGNOSIS — M25512 Pain in left shoulder: Secondary | ICD-10-CM | POA: Diagnosis present

## 2015-09-15 DIAGNOSIS — C50212 Malignant neoplasm of upper-inner quadrant of left female breast: Secondary | ICD-10-CM

## 2015-09-15 DIAGNOSIS — Z170 Estrogen receptor positive status [ER+]: Secondary | ICD-10-CM | POA: Diagnosis not present

## 2015-09-15 LAB — CBC WITH DIFFERENTIAL/PLATELET
BASO%: 1.2 % (ref 0.0–2.0)
BASOS ABS: 0.1 10*3/uL (ref 0.0–0.1)
EOS ABS: 0.1 10*3/uL (ref 0.0–0.5)
EOS%: 2 % (ref 0.0–7.0)
HEMATOCRIT: 40.3 % (ref 34.8–46.6)
HGB: 13.3 g/dL (ref 11.6–15.9)
LYMPH#: 1.9 10*3/uL (ref 0.9–3.3)
LYMPH%: 33.4 % (ref 14.0–49.7)
MCH: 30 pg (ref 25.1–34.0)
MCHC: 32.9 g/dL (ref 31.5–36.0)
MCV: 91.2 fL (ref 79.5–101.0)
MONO#: 0.6 10*3/uL (ref 0.1–0.9)
MONO%: 10.2 % (ref 0.0–14.0)
NEUT#: 3.1 10*3/uL (ref 1.5–6.5)
NEUT%: 53.2 % (ref 38.4–76.8)
Platelets: 236 10*3/uL (ref 145–400)
RBC: 4.41 10*6/uL (ref 3.70–5.45)
RDW: 12.9 % (ref 11.2–14.5)
WBC: 5.8 10*3/uL (ref 3.9–10.3)

## 2015-09-15 LAB — COMPREHENSIVE METABOLIC PANEL
ALBUMIN: 4.3 g/dL (ref 3.5–5.0)
ALK PHOS: 53 U/L (ref 40–150)
ALT: 10 U/L (ref 0–55)
ANION GAP: 9 meq/L (ref 3–11)
AST: 19 U/L (ref 5–34)
BUN: 14.2 mg/dL (ref 7.0–26.0)
CALCIUM: 9.4 mg/dL (ref 8.4–10.4)
CHLORIDE: 105 meq/L (ref 98–109)
CO2: 27 mEq/L (ref 22–29)
Creatinine: 0.9 mg/dL (ref 0.6–1.1)
EGFR: 78 mL/min/{1.73_m2} — AB (ref >90)
Glucose: 77 mg/dl (ref 70–140)
POTASSIUM: 3.7 meq/L (ref 3.5–5.1)
Sodium: 141 mEq/L (ref 136–145)
Total Bilirubin: 0.81 mg/dL (ref 0.20–1.20)
Total Protein: 7.5 g/dL (ref 6.4–8.3)

## 2015-09-15 NOTE — Therapy (Signed)
[place], [place], [address] Phone: [phone]   Fax:  [phone]  Physical Therapy Evaluation  Patient Details  Name: Gabriela [name] MRN: [id number] Date of Birth: 05/06/1971 Referring Provider: Dr. Rolm Bookbinder  Encounter Date: 09/15/2015      PT End of Session - 09/15/15 1431    Visit Number 1   Number of Visits 1   PT Start Time 1228   PT Stop Time 1252   PT Time Calculation (min) 24 min   Activity Tolerance Patient tolerated treatment well   Behavior During Therapy [facility] for tasks assessed/performed      Past Medical History  Diagnosis Date  . Breast cancer of upper-inner quadrant of left female breast ([place]) 09/09/2015  . Breast cancer ([place])     History reviewed. No pertinent past surgical history.  There were no vitals filed for this visit.  Visit Diagnosis:  Carcinoma of left breast upper inner quadrant ([place]) - Plan: PT plan of care cert/re-cert  Left shoulder pain - Plan: PT plan of care cert/re-cert      Subjective Assessment - 09/15/15 1431    Subjective Patient reports she is here today for an assessment with her medical team for her newly diagnosed left breast cancer.   Patient is accompained by: Family member   Pertinent History Patient was diagnosed on 08/24/15 with left grade 2-3 invasive ductal carcinoma breast cancer.  It is ER/PR positive and HER2 negative with a Ki67 of 10%. The mass measures 1.6 cm located in the upper inner quadrant.   Patient Stated Goals reduce lymphedema risk and learn post op shoulder ROM HEP            [facility] PT Assessment - 09/15/15 0001    Assessment   Medical Diagnosis Left breast cancer   Referring Provider Dr. Rolm Bookbinder   Onset Date/Surgical Date 08/24/15   Hand Dominance Right   Prior Therapy none   Precautions   Precautions Other (comment)   Precaution Comments Active breast cancer   Restrictions   Weight Bearing  Restrictions No   Balance Screen   Has the patient fallen in the past 6 months No   Has the patient had a decrease in activity level because of a fear of falling?  No   Is the patient reluctant to leave their home because of a fear of falling?  No   Home Environment   Living Environment Private residence   Living Arrangements Spouse/significant other;Children  Husband and 2 children ages 81 and 24   Available Help at Discharge Family  Also has 72 and 31 y.o. 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 [employer]   Leisure She does not exercise   Cognition   Overall Cognitive Status Within Functional Limits for tasks assessed   Posture/Postural Control   Posture/Postural Control No significant limitations   ROM / Strength   AROM / PROM / Strength AROM;Strength   AROM   AROM Assessment Site Shoulder   Right/Left Shoulder Right;Left   Right Shoulder Extension 62 Degrees   Right Shoulder Flexion 152 Degrees   Right Shoulder ABduction 180 Degrees   Right Shoulder Internal Rotation 67 Degrees   Right Shoulder External Rotation 86 Degrees   Left Shoulder Extension 65 Degrees  Left Shoulder Flexion 145 Degrees   Left Shoulder ABduction 166 Degrees   Left Shoulder Internal Rotation 60 Degrees   Left Shoulder External Rotation 86 Degrees   Strength   Overall Strength Within functional limits for tasks performed           LYMPHEDEMA/ONCOLOGY QUESTIONNAIRE - 09/15/15 1429    Type   Cancer Type Left breast cancer   Lymphedema Assessments   Lymphedema Assessments Upper extremities   Right Upper Extremity Lymphedema   10 cm Proximal to Olecranon Process 23 cm   Olecranon Process 21.5 cm   10 cm Proximal to Ulnar Styloid Process 18.6 cm   Just Proximal to Ulnar Styloid Process 14.2 cm   Across Hand at  [employer] 17.6 cm   At [place] of 2nd Digit 6.1 cm   Left Upper Extremity Lymphedema   10 cm Proximal to Olecranon Process 23.4 cm   Olecranon Process 21.6 cm   10 cm Proximal to Ulnar Styloid Process 17.9 cm   Just Proximal to Ulnar Styloid Process 13.5 cm   Across Hand at [employer] 17.5 cm   At [place] of 2nd Digit 5.8 cm       Patient was instructed today in a home exercise program today for post op shoulder range of motion. These included active assist shoulder flexion in sitting, scapular retraction, wall walking with shoulder abduction, and hands behind head external rotation.  She was encouraged to do these twice a day, holding 3 seconds and repeating 5 times when permitted by her physician.         PT Education - 09/15/15 1430    Education provided Yes   Education Details Lymphedema risk reduction and post op shoulder ROM HEP   Person(s) Educated Patient   Methods Explanation;Demonstration;Handout   Comprehension Returned demonstration;Verbalized understanding              Breast Clinic Goals - 09/15/15 1436    Patient will be able to verbalize understanding of pertinent lymphedema risk reduction practices relevant to her diagnosis specifically related to skin care.   Time 1   Period Days   Status Achieved   Patient will be able to return demonstrate and/or verbalize understanding of the post-op home exercise program related to regaining shoulder range of motion.   Time 1   Period Days   Status Achieved   Patient will be able to verbalize understanding of the importance of attending the postoperative After Breast Cancer Class for further lymphedema risk reduction education and therapeutic exercise.   Time 1   Period Days   Status Achieved              Plan - 09/15/15 1431    Clinical Impression Statement Patient was diagnosed on 08/24/15 with left grade 2-3 invasive ductal carcinoma breast cancer.  It is ER/PR positive and HER2 negative with a Ki67  of 10%. The mass measures 1.6 cm located in the upper inner quadrant.  She is planning to have genetic testing, a mammogram on her right breast (previously declined this), a bilateral mastectomy with a sentinel node biopsy, and radiation therapy followed by anti-estrogen therapy.  She may benefit from post op PT to regain shoulder ROM and strength and reduce lymphedema risk.   Pt will benefit from skilled therapeutic intervention in order to improve on the following deficits Decreased strength;Decreased knowledge of precautions;Pain;Decreased range of motion;Impaired UE functional use   Rehab Potential Good   Clinical Impairments Affecting Rehab  Potential None   PT Frequency One time visit   PT Treatment/Interventions Therapeutic exercise;Patient/family education   PT Next Visit Plan Will f/u after surgery   PT Home Exercise Plan Post op shoulder ROM HEP   Consulted and Agree with Plan of Care Patient;Family member/caregiver   Family Member Consulted Husband     Patient will follow up at outpatient cancer rehab if needed following surgery.  If the patient requires physical therapy at that time, a specific plan will be dictated and sent to the referring physician for approval. The patient was educated today on appropriate basic range of motion exercises to begin post operatively and the importance of attending the After Breast Cancer class following surgery.  Patient was educated today on lymphedema risk reduction practices as it pertains to recommendations that will benefit the patient immediately following surgery.  She verbalized good understanding.  No additional physical therapy is indicated at this time.       Problem List Patient Active Problem List   Diagnosis Date Noted  . Breast cancer of upper-inner quadrant of left female breast ([place]) 09/09/2015   Annia Friendly, PT 09/15/2015 2:39 PM  [place], [place], [address] Phone: [phone]   Fax:  [phone]  Name: Gabriela [name] MRN: [id number] Date of Birth: 24-Jun-1971

## 2015-09-15 NOTE — Progress Notes (Signed)
Subjective:     Patient ID: Gabriela [name], female   DOB: 23-Oct-1970, 45 y.o.   MRN: [id number]  HPI   Review of Systems     Objective:   Physical Exam For the patient to understand and be given the tools to implement a healthy plant based diet during their cancer diagnosis.     Assessment:     Patient was seen today and found to be vexed and accompanied by her husband. Pts ht 5'8'', 125 pounds, BMI 19. Pt laughed upon the Dietitian verbalizing who she was and stated I am vegan and I do not need a dietitians help.     Plan:      A folder of evidence based information with a focus on a plant based diet and general nutrition during cancer was given to the patient.   As a part of the continuum of care the cancer dietitian's contact information was given to the patient in the event they would like to have a follow up appointment.

## 2015-09-15 NOTE — Patient Instructions (Signed)

## 2015-09-15 NOTE — Progress Notes (Signed)
[facility] Breast Clinic Psychosocial Distress Screening Clinical Social Work  Clinical Social Work met with Gabriela [name] and her husband, John at [facility] [place] and introduced self, explained role of CSW/Gabriela [name] and Family Support Team and reviewed distress screening protocol.  The patient scored a 4 on the Psychosocial Distress Thermometer which indicates mild distress. Clinical Social Worker to assess for distress and other psychosocial needs. Gabriela [name] did not feel she had concerns about anything on the distress screen sheet. She shared she is somewhat concerned about how her children are adjusting. Gabriela [name] has two sons, ages 78yo and 28yo. CSW provided Gabriela [name] with a booklet, Talking with Teens about Cancer. CSW reviewed additional resources/options for support through the [facility], provided [employer] calendar.  Gabriela [name] declined assistance with other needs, but agrees to reach out to CSW as needed.   ONCBCN DISTRESS SCREENING 09/15/2015  Screening Type Initial Screening  Distress experienced in past week (1-10) 4    Clinical Social Worker follow up needed: No.  If yes, follow up plan:   Loren Racer, [place]  [facility] Phone: [phone] Fax: [phone]

## 2015-09-15 NOTE — Progress Notes (Signed)
[name] is a very pleasant 45 y.o. female from [place], [place] with newly diagnosed grade 1-2 invasive ductal carcinoma with DCIS of the left breast.  Biopsy results revealed the tumor's prognostic profile is ER positive, PR positive, and HER2/neu negative. Ki67 is 10%.  She presents today with her husband to the [facility]) for treatment consideration and recommendations from the breast surgeon, radiation oncologist, and medical oncologist.     I briefly met with [name] and her husband during her [facility] visit today. We discussed the purpose of the Survivorship Clinic, which will include monitoring for recurrence, coordinating completion of age and gender-appropriate cancer screenings, promotion of overall wellness, as well as managing potential late/long-term side effects of anti-cancer treatments.    The treatment plan for [name] will likely include surgery and anti-estrogen therapy.  She will meet with the Genetics Counselor due to her age. As of today, the intent of treatment for [name] is cure, therefore she will be eligible for the Survivorship Clinic upon her completion of treatment.  Her survivorship care plan (SCP) document will be drafted and updated throughout the course of her treatment trajectory. She will receive the SCP in an office visit with myself in the Survivorship Clinic once she has completed treatment.   [name] was encouraged to ask questions and all questions were answered to her satisfaction.  She was given my business card and encouraged to contact me with any concerns regarding survivorship.  I look forward to participating in her care.   Kenn File, [place] [phone]

## 2015-09-15 NOTE — Progress Notes (Signed)
  Radiation Oncology         [phone]) [phone] ________________________________  Initial outpatient Consultation - Date: 09/15/2015   Name: Gabriela [name] MRN: [id number]   DOB: Nov 16, 1970  REFERRING PHYSICIAN: Rolm Bookbinder, MD  DIAGNOSIS AND STAGE: Breast cancer of upper-inner quadrant of left female breast [facility])   Staging form: Breast, AJCC 7th Edition     Clinical stage from 09/06/2015: Stage IA (T1c, N0, M0) - Signed by Truitt Merle, MD on 09/14/2015   HISTORY OF PRESENT ILLNESS::Gabriela [name] is a 45 y.o. female with a palpable left breast mass. She presented to the breast center and refused a mammogram. Ultrasound showed a 1.6 cm mass in the left breast and a biopsy showed ER/PR positive, Ki67 10%, and Her-2 negative. Ultrasound of axilla is negative. Her mother was treated for breast cancer in her 68s and the patient is only interested in bilateral mastectomies. She is accompanied by her husband.   PREVIOUS RADIATION THERAPY: No  Gynecologic History:  Age at first menstrual period? 9  Still having periods? yes              Approximate date of last period? 08/25/15  Are periods regular? yes Obstetric History:  How many children have you carried to term? 2              Age at first live birth? 32  Pregnant now or trying to get pregnant? No  Have you used birth control pills or hormone shots for contraception? No             Interested in learning more about the options to preserve fertility? No Health Maintenance:  Have you ever had a colonoscopy? No   Have you ever had a bone density? No   Date of your last PAP smear? June 2016             Date of your FIRST mammogram? N/A  Past medical, social and family history were reviewed in the electronic chart. Review of symptoms was reviewed in the electronic chart. Medications were reviewed in the electronic chart.   PHYSICAL EXAM: BP: 94/50 mmHg, Pulse Rate: 66, Resp: 19, Temp: 98.1, Temp Source: Oral, SpO2: 100%, Weight, 125 lb  9.6 oz, Height: N/A  Pleasant female in no distress. Alert and oriented.   IMPRESSION: [name] is a 45 y.o female with Stage IA (T1c, N0, M0) breast cancer of the upper-inner quadrant of the left female breast.   PLAN: She will undergo genetic testing and MRI for evaluation of the bilateral breasts. Based on the size of her tumor, an Oncotype test will be ordered. She will undergo bilateral mastectomy with reconstruction and has been referred to plastic surgery. There is currently no indication for radiation. I discussed with her that if she has a tumor over 5 cm or a positive lymph node, then she will need to undergo radiation and we can discuss that.   I spent 10 minutes  face to face with the patient and more than 50% of that time was spent in counseling and/or coordination of care.   ------------------------------------------------  Thea Silversmith, MD  This document serves as a record of services personally performed by Thea Silversmith, MD. It was created on her behalf by Jenell Milliner, a trained medical scribe. The creation of this record is based on the scribe's personal observations and the provider's statements to them. This document has been checked and approved by the attending provider.

## 2015-09-16 ENCOUNTER — Encounter: Payer: No Typology Code available for payment source | Admitting: Genetic Counselor

## 2015-09-16 ENCOUNTER — Other Ambulatory Visit: Payer: No Typology Code available for payment source

## 2015-09-16 ENCOUNTER — Encounter: Payer: Self-pay | Admitting: Hematology

## 2015-09-17 ENCOUNTER — Encounter: Payer: Self-pay | Admitting: Hematology

## 2015-09-17 ENCOUNTER — Telehealth: Payer: Self-pay | Admitting: Hematology

## 2015-09-17 NOTE — Telephone Encounter (Signed)
Pt called and requested a prescription of Tamoxifen. She is scheduled to have breast surgery in one week. I called her back but did not reach her and her voice mail was fall.  So I called her husband. Giving her pending breast surgery with a week, I do not recommend her to start tamoxifen now, giving the very limited benefit and potential side effects from Tamoxifen, and I'll see her back in 2-3 weeks after her surgery. He voiced good understanding, and will tell his wife.  Truitt Merle  09/17/2015

## 2015-09-17 NOTE — Progress Notes (Signed)
Received referral call for FA from [facility] asking to contact the patient and possibly meet with her. I called the patient to discuss her financial concerns. Patient states she was told she needed to come in and fill out an application for assistance for her hospital stay. Patient wasn't sure who she was to see. Asked patient if she has insurance due to the fact there was no card present. Patient states yes but no because right now they are pretty much paying for everything. She states she has a card but did not present at her visit due to the fact that she went to the wrong place at first. Asked patient if she had contacted the insurance company to find out what her coverage will cover. Patient states no but she will have her husband contact them. Advised patient that once coverage is determined and her treatment plan is established, then I will contact her to discuss. Patient asked if her husband could contact me with questions and I stated yes if it was ok with her. Patient verbalized understanding and has my name and number for any additional questions or concerns.

## 2015-09-17 NOTE — Progress Notes (Signed)
Pt's spouse Gabriela [name] called to get an explanation of what was explained to his wife. He had questions about fees for the hospital side. I advised him for the surgery, the pre-service center would reach out to him to discuss those fees prior to surgery. He asked for the number and I gave him the main number of [phone]. He also wanted to know if there was assistance available for her treatment. I explained to him that once her treatment plan has been established, I will review her chart to see what he plan is and further discuss at that time if she will be doing chemo. I advised him that Radiation has their own Financial [employer]ocates as well as the hospital that may reach out to them. Patient's spouse had several questions and I answered them as best as I could. He has my number for any additional financial questions or concerns.

## 2015-09-20 ENCOUNTER — Encounter (HOSPITAL_COMMUNITY): Payer: Self-pay

## 2015-09-20 ENCOUNTER — Encounter: Payer: Self-pay | Admitting: *Deleted

## 2015-09-20 ENCOUNTER — Telehealth: Payer: Self-pay | Admitting: *Deleted

## 2015-09-20 ENCOUNTER — Encounter (HOSPITAL_COMMUNITY)
Admission: RE | Admit: 2015-09-20 | Discharge: 2015-09-20 | Disposition: A | Discharge status: Home / Self Care | Payer: No Typology Code available for payment source | Source: Ambulatory Visit | Attending: General Surgery | Admitting: General Surgery

## 2015-09-20 DIAGNOSIS — Z803 Family history of malignant neoplasm of breast: Secondary | ICD-10-CM | POA: Insufficient documentation

## 2015-09-20 DIAGNOSIS — C50912 Malignant neoplasm of unspecified site of left female breast: Secondary | ICD-10-CM | POA: Insufficient documentation

## 2015-09-20 DIAGNOSIS — Z01812 Encounter for preprocedural laboratory examination: Secondary | ICD-10-CM | POA: Diagnosis present

## 2015-09-20 HISTORY — DX: Anemia, unspecified: D64.9

## 2015-09-20 HISTORY — DX: Depression, unspecified: F32.A

## 2015-09-20 HISTORY — DX: Major depressive disorder, single episode, unspecified: F32.9

## 2015-09-20 LAB — BASIC METABOLIC PANEL
ANION GAP: 10 (ref 5–15)
BUN: 9 mg/dL (ref 6–20)
CHLORIDE: 105 mmol/L (ref 101–111)
CO2: 23 mmol/L (ref 22–32)
CREATININE: 0.88 mg/dL (ref 0.44–1.00)
Calcium: 9.2 mg/dL (ref 8.9–10.3)
GFR calc non Af Amer: >60 mL/min (ref >60)
Glucose, Bld: 88 mg/dL (ref 65–99)
POTASSIUM: 3.7 mmol/L (ref 3.5–5.1)
SODIUM: 138 mmol/L (ref 135–145)

## 2015-09-20 LAB — CBC
HEMATOCRIT: 34.6 % — AB (ref 36.0–46.0)
HEMOGLOBIN: 11.5 g/dL — AB (ref 12.0–15.0)
MCH: 29.6 pg (ref 26.0–34.0)
MCHC: 33.2 g/dL (ref 30.0–36.0)
MCV: 89.2 fL (ref 78.0–100.0)
PLATELETS: 242 10*3/uL (ref 150–400)
RBC: 3.88 MIL/uL (ref 3.87–5.11)
RDW: 12.7 % (ref 11.5–15.5)
WBC: 7.8 10*3/uL (ref 4.0–10.5)

## 2015-09-20 LAB — HCG, SERUM, QUALITATIVE: Preg, Serum: NEGATIVE

## 2015-09-20 NOTE — Telephone Encounter (Signed)
Spoke to pt concerning [place] from 09/15/15. R/s genetics of 10/18/12 and scheduled f/u appt with Dr. Burr Medico on 10/11/15. Discussed approximate time of final pathology from surgery date as well as her pathology report from her initial bx. Informed pt anesthesia will reach out to her to discuss medications and process for general anesthesia.  Denies further needs at this time. Encourage pt to call with questions or concerns. Received verbal understanding.

## 2015-09-20 NOTE — Progress Notes (Signed)
PCP is Dr Carlena Sax, but Dr Maxwell Caul is no longer at [facility], she states she has not been back to get established with another Dr. Denies ever seeing a cardiologist. Denies ever having a card cath, stress test, or echo. Pt request Dr Lyndle Herrlich for the day of surgery- Levada Dy called and informed. Pt also has questions about pain meds, and nerve block- Levada Dy her to speak with her and her husband. Pt states that her bp runs low most of the time.

## 2015-09-20 NOTE — Pre-Procedure Instructions (Signed)
Gabriela [name]  09/20/2015      [facility] - [place], [place] - [place] [address] Phone: [phone] Fax: [phone]  [facility] [place] [place] - [id number] [place] [id number] Phone: [phone] Fax: [place] [address] - [place], [place] [address] Phone: [phone] Fax: [phone]    Your procedure is scheduled on March 30.  Report to [facility] Admitting at 1000 A.M.  Call this number if you have problems the morning of surgery:  [phone]   Remember:  Do not eat food or drink liquids after midnight.  Take these medicines the morning of surgery with A SIP OF WATER   Stop taking aspirin, Ibuprofen Advil, Motrin, Aleve, BC's, Goody's, Herbal medications, Fish Oil   Do not wear jewelry, make-up or nail polish.  Do not wear lotions, powders, or perfumes.  You may wear deodorant.  Do not shave 48 hours prior to surgery.  Men may shave face and neck.  Do not bring valuables to the hospital.  [facility] is not responsible for any belongings or valuables.  Contacts, dentures or bridgework may not be worn into surgery.  Leave your suitcase in the car.  After surgery it may be brought to your room.  For patients admitted to the hospital, discharge time will be determined by your treatment team.  Patients discharged the day of surgery will not be allowed to drive home.    Special instructions:  [facility] - Preparing for Surgery  Before surgery, you can play an important role.  Because skin is not sterile, your skin needs to be as free of germs as possible.  You can reduce the number of germs on you skin by washing with CHG (chlorahexidine gluconate) soap before surgery.  CHG is an antiseptic cleaner which kills germs and bonds with  the skin to continue killing germs even after washing.  Please DO NOT use if you have an allergy to CHG or antibacterial soaps.  If your skin becomes reddened/irritated stop using the CHG and inform your nurse when you arrive at Short Stay.  Do not shave (including legs and underarms) for at least 48 hours prior to the first CHG shower.  You may shave your face.  Please follow these instructions carefully:   1.  Shower with CHG Soap the night before surgery and the  morning of Surgery.  2.  If you choose to wash your hair, wash your hair first as usual with your  normal shampoo.  3.  After you shampoo, rinse your hair and body thoroughly to remove the Shampoo.  4.  Use CHG as you would any other liquid soap.  You can apply chg directly  to the skin and wash gently with scrungie or a clean washcloth.  5.  Apply the CHG Soap to your body ONLY FROM THE NECK DOWN.   Do not use on open wounds or open sores.  Avoid contact with your eyes, ears, mouth and genitals (private parts).  Wash genitals (private parts) with your normal soap.  6.  Wash thoroughly, paying special attention to the area where your surgery will be performed.  7.  Thoroughly rinse your body with warm water from the neck down.  8.  DO NOT shower/wash with your normal soap after using  and rinsing off the CHG Soap.  9.  Pat yourself dry with a clean towel.            10.  Wear clean pajamas.            11.  Place clean sheets on your bed the night of your first shower and do not sleep with pets.  Day of Surgery  Do not apply any lotions/deoderants the morning of surgery.  Please wear clean clothes to the hospital/surgery center.     Please read over the following fact sheets that you were given. Pain Booklet, Coughing and Deep Breathing and Surgical Site Infection Prevention

## 2015-09-20 NOTE — Anesthesia Preprocedure Evaluation (Signed)
Anesthesia Evaluation  Patient identified by MRN, date of birth, ID band Patient awake    Reviewed: Allergy & Precautions, H&P , Patient's Chart, lab work & pertinent test results, reviewed documented beta blocker date and time   Airway Mallampati: II  TM Distance: >3 FB Neck ROM: full    Dental no notable dental hx.    Pulmonary former smoker,    Pulmonary exam normal breath sounds clear to auscultation       Cardiovascular  Rhythm:regular Rate:Normal     Neuro/Psych    GI/Hepatic   Endo/Other    Renal/GU      Musculoskeletal   Abdominal   Peds  Hematology   Anesthesia Other Findings   Reproductive/Obstetrics                             Anesthesia Physical Anesthesia Plan  ASA: II  Anesthesia Plan: General   Post-op Pain Management:    Induction: Intravenous  Airway Management Planned: LMA  Additional Equipment:   Intra-op Plan:   Post-operative Plan: Extubation in OR  Informed Consent: I have reviewed the patients History and Physical, chart, labs and discussed the procedure including the risks, benefits and alternatives for the proposed anesthesia with the patient or authorized representative who has indicated his/her understanding and acceptance.   Dental Advisory Given and Dental advisory given  Plan Discussed with: CRNA and Surgeon  Anesthesia Plan Comments: (  Discussed general anesthesia, including possible nausea, instrumentation of airway, sore throat,pulmonary aspiration, etc. I asked if the were any outstanding questions, or  concerns before we proceeded. )        Anesthesia Quick Evaluation

## 2015-09-22 MED ORDER — CEFAZOLIN SODIUM-DEXTROSE 2-4 GM/100ML-% IV SOLN
2.0000 g | INTRAVENOUS | Status: AC
  Administered 2015-09-23: 2 g via INTRAVENOUS
  Filled 2015-09-22: qty 100

## 2015-09-23 ENCOUNTER — Encounter (HOSPITAL_COMMUNITY)
Admission: RE | Disposition: A | Discharge status: Home / Self Care | Payer: Self-pay | Source: Ambulatory Visit | Attending: General Surgery

## 2015-09-23 ENCOUNTER — Ambulatory Visit (HOSPITAL_COMMUNITY): Payer: No Typology Code available for payment source | Admitting: Anesthesiology

## 2015-09-23 ENCOUNTER — Ambulatory Visit (HOSPITAL_COMMUNITY)
Admission: RE | Admit: 2015-09-23 | Discharge: 2015-09-23 | Disposition: A | Discharge status: Home / Self Care | Payer: No Typology Code available for payment source | Source: Ambulatory Visit | Attending: General Surgery | Admitting: General Surgery

## 2015-09-23 ENCOUNTER — Observation Stay (HOSPITAL_COMMUNITY)
Admission: RE | Admit: 2015-09-23 | Discharge: 2015-09-24 | Disposition: A | Discharge status: Home / Self Care | Payer: No Typology Code available for payment source | Source: Ambulatory Visit | Attending: General Surgery | Admitting: General Surgery

## 2015-09-23 ENCOUNTER — Encounter (HOSPITAL_COMMUNITY): Payer: Self-pay | Admitting: Certified Registered Nurse Anesthetist

## 2015-09-23 DIAGNOSIS — Z170 Estrogen receptor positive status [ER+]: Secondary | ICD-10-CM | POA: Diagnosis not present

## 2015-09-23 DIAGNOSIS — C50212 Malignant neoplasm of upper-inner quadrant of left female breast: Secondary | ICD-10-CM

## 2015-09-23 DIAGNOSIS — N6012 Diffuse cystic mastopathy of left breast: Secondary | ICD-10-CM | POA: Insufficient documentation

## 2015-09-23 DIAGNOSIS — Z4001 Encounter for prophylactic removal of breast: Secondary | ICD-10-CM | POA: Diagnosis not present

## 2015-09-23 DIAGNOSIS — Z803 Family history of malignant neoplasm of breast: Secondary | ICD-10-CM | POA: Diagnosis not present

## 2015-09-23 DIAGNOSIS — N6011 Diffuse cystic mastopathy of right breast: Secondary | ICD-10-CM | POA: Insufficient documentation

## 2015-09-23 DIAGNOSIS — C50412 Malignant neoplasm of upper-outer quadrant of left female breast: Secondary | ICD-10-CM | POA: Diagnosis present

## 2015-09-23 DIAGNOSIS — Z87891 Personal history of nicotine dependence: Secondary | ICD-10-CM | POA: Diagnosis not present

## 2015-09-23 DIAGNOSIS — Z882 Allergy status to sulfonamides status: Secondary | ICD-10-CM | POA: Insufficient documentation

## 2015-09-23 HISTORY — PX: MASTECTOMY COMPLETE / SIMPLE W/ SENTINEL NODE BIOPSY: SUR846

## 2015-09-23 HISTORY — DX: Pain in left shoulder: M25.512

## 2015-09-23 HISTORY — DX: Other chronic pain: G89.29

## 2015-09-23 HISTORY — PX: MASTECTOMY W/ SENTINEL NODE BIOPSY: SHX2001

## 2015-09-23 HISTORY — PX: MASTECTOMY COMPLETE / SIMPLE: SUR845

## 2015-09-23 SURGERY — MASTECTOMY WITH SENTINEL LYMPH NODE BIOPSY
Anesthesia: General | Site: Breast | Laterality: Bilateral

## 2015-09-23 MED ORDER — 0.9 % SODIUM CHLORIDE (POUR BTL) OPTIME
TOPICAL | Status: DC | PRN
  Administered 2015-09-23: 1000 mL

## 2015-09-23 MED ORDER — METHYLENE BLUE 0.5 % INJ SOLN
INTRAVENOUS | Status: AC
  Filled 2015-09-23: qty 10

## 2015-09-23 MED ORDER — DEXAMETHASONE SODIUM PHOSPHATE 4 MG/ML IJ SOLN
INTRAMUSCULAR | Status: DC | PRN
  Administered 2015-09-23: 4 mg via INTRAVENOUS

## 2015-09-23 MED ORDER — GLYCOPYRROLATE 0.2 MG/ML IJ SOLN
INTRAMUSCULAR | Status: DC | PRN
  Administered 2015-09-23 (×2): 0.1 mg via INTRAVENOUS

## 2015-09-23 MED ORDER — FENTANYL CITRATE (PF) 100 MCG/2ML IJ SOLN
INTRAMUSCULAR | Status: AC
Start: 2015-09-23 — End: 2015-09-23
  Administered 2015-09-23 (×2): 50 ug via INTRAVENOUS
  Filled 2015-09-23: qty 2

## 2015-09-23 MED ORDER — LACTATED RINGERS IV SOLN
INTRAVENOUS | Status: DC
  Administered 2015-09-23 (×2): via INTRAVENOUS

## 2015-09-23 MED ORDER — EPHEDRINE SULFATE 50 MG/ML IJ SOLN
INTRAMUSCULAR | Status: AC
  Filled 2015-09-23: qty 2

## 2015-09-23 MED ORDER — OXYCODONE HCL 5 MG PO TABS
ORAL_TABLET | ORAL | Status: AC
Start: 2015-09-23 — End: 2015-09-24
  Filled 2015-09-23: qty 2

## 2015-09-23 MED ORDER — ONDANSETRON 4 MG PO TBDP: 4.0000 mg | ORAL_TABLET | Freq: Four times a day (QID) | ORAL | Status: DC | PRN

## 2015-09-23 MED ORDER — ONDANSETRON HCL 4 MG/2ML IJ SOLN
INTRAMUSCULAR | Status: AC
  Filled 2015-09-23: qty 2

## 2015-09-23 MED ORDER — SODIUM CHLORIDE 0.9 % IV SOLN
INTRAVENOUS | Status: DC
  Administered 2015-09-23: 20:00:00 via INTRAVENOUS

## 2015-09-23 MED ORDER — MIDAZOLAM HCL 5 MG/5ML IJ SOLN
INTRAMUSCULAR | Status: DC | PRN
  Administered 2015-09-23 (×2): 1 mg via INTRAVENOUS

## 2015-09-23 MED ORDER — SUCCINYLCHOLINE CHLORIDE 20 MG/ML IJ SOLN
INTRAMUSCULAR | Status: AC
  Filled 2015-09-23: qty 1

## 2015-09-23 MED ORDER — SUGAMMADEX SODIUM 500 MG/5ML IV SOLN
INTRAVENOUS | Status: AC
  Filled 2015-09-23: qty 5

## 2015-09-23 MED ORDER — STERILE WATER FOR INJECTION IJ SOLN
INTRAMUSCULAR | Status: AC
  Filled 2015-09-23: qty 10

## 2015-09-23 MED ORDER — ACETAMINOPHEN 325 MG PO TABS
ORAL_TABLET | ORAL | Status: AC
  Filled 2015-09-23: qty 2

## 2015-09-23 MED ORDER — ONDANSETRON HCL 4 MG/2ML IJ SOLN
4.0000 mg | Freq: Four times a day (QID) | INTRAMUSCULAR | Status: DC | PRN
  Administered 2015-09-23 – 2015-09-24 (×2): 4 mg via INTRAVENOUS
  Filled 2015-09-23 (×2): qty 2

## 2015-09-23 MED ORDER — FENTANYL CITRATE (PF) 250 MCG/5ML IJ SOLN
INTRAMUSCULAR | Status: AC
  Filled 2015-09-23: qty 5

## 2015-09-23 MED ORDER — ACETAMINOPHEN 500 MG PO TABS
1000.0000 mg | ORAL_TABLET | Freq: Four times a day (QID) | ORAL | Status: DC
  Administered 2015-09-24: 1000 mg via ORAL
  Filled 2015-09-23: qty 2

## 2015-09-23 MED ORDER — ACETAMINOPHEN 325 MG PO TABS: 650.0000 mg | ORAL_TABLET | Freq: Once | ORAL | Status: DC

## 2015-09-23 MED ORDER — OXYCODONE HCL 5 MG PO TABS
5.0000 mg | ORAL_TABLET | ORAL | Status: DC | PRN
  Administered 2015-09-23 – 2015-09-24 (×2): 10 mg via ORAL
  Filled 2015-09-23 (×2): qty 2

## 2015-09-23 MED ORDER — HYDROMORPHONE HCL 1 MG/ML IJ SOLN
INTRAMUSCULAR | Status: AC
  Filled 2015-09-23: qty 1

## 2015-09-23 MED ORDER — GLYCOPYRROLATE 0.2 MG/ML IJ SOLN
INTRAMUSCULAR | Status: AC
  Filled 2015-09-23: qty 1

## 2015-09-23 MED ORDER — MIDAZOLAM HCL 2 MG/2ML IJ SOLN
INTRAMUSCULAR | Status: AC
  Filled 2015-09-23: qty 2

## 2015-09-23 MED ORDER — PROPOFOL 10 MG/ML IV BOLUS
INTRAVENOUS | Status: AC
  Filled 2015-09-23: qty 20

## 2015-09-23 MED ORDER — HYDROMORPHONE HCL 1 MG/ML IJ SOLN
0.2500 mg | INTRAMUSCULAR | Status: DC | PRN
  Administered 2015-09-23 (×2): 0.25 mg via INTRAVENOUS

## 2015-09-23 MED ORDER — TECHNETIUM TC 99M SULFUR COLLOID FILTERED
1.0000 | Freq: Once | INTRAVENOUS | Status: AC | PRN
  Administered 2015-09-23: 1 via INTRADERMAL

## 2015-09-23 MED ORDER — HYDROMORPHONE HCL 1 MG/ML IJ SOLN
INTRAMUSCULAR | Status: DC | PRN
  Administered 2015-09-23 (×2): 0.5 mg via INTRAVENOUS

## 2015-09-23 MED ORDER — HYDROMORPHONE HCL 1 MG/ML IJ SOLN
INTRAMUSCULAR | Status: AC
Start: 2015-09-23 — End: 2015-09-24
  Filled 2015-09-23: qty 1

## 2015-09-23 MED ORDER — EPHEDRINE SULFATE 50 MG/ML IJ SOLN
INTRAMUSCULAR | Status: DC | PRN
  Administered 2015-09-23: 5 mg via INTRAVENOUS

## 2015-09-23 MED ORDER — MORPHINE SULFATE (PF) 2 MG/ML IV SOLN
2.0000 mg | INTRAVENOUS | Status: DC | PRN
  Administered 2015-09-23 (×2): 1 mg via INTRAVENOUS
  Filled 2015-09-23 (×2): qty 1

## 2015-09-23 MED ORDER — ONDANSETRON HCL 4 MG/2ML IJ SOLN
INTRAMUSCULAR | Status: AC
  Administered 2015-09-23: 4 mg
  Filled 2015-09-23: qty 2

## 2015-09-23 MED ORDER — MIDAZOLAM HCL 2 MG/2ML IJ SOLN
INTRAMUSCULAR | Status: AC
  Administered 2015-09-23 (×2): 1 mg via INTRAVENOUS
  Filled 2015-09-23: qty 2

## 2015-09-23 MED ORDER — PROPOFOL 10 MG/ML IV BOLUS
INTRAVENOUS | Status: DC | PRN
  Administered 2015-09-23: 10 mg via INTRAVENOUS
  Administered 2015-09-23: 50 mg via INTRAVENOUS
  Administered 2015-09-23: 10 mg via INTRAVENOUS
  Administered 2015-09-23: 50 mg via INTRAVENOUS

## 2015-09-23 MED ORDER — FENTANYL CITRATE (PF) 100 MCG/2ML IJ SOLN
INTRAMUSCULAR | Status: DC | PRN
  Administered 2015-09-23 (×10): 25 ug via INTRAVENOUS

## 2015-09-23 MED ORDER — ONDANSETRON HCL 4 MG/2ML IJ SOLN
INTRAMUSCULAR | Status: DC | PRN
  Administered 2015-09-23: 4 mg via INTRAVENOUS

## 2015-09-23 MED ORDER — LIDOCAINE HCL (CARDIAC) 20 MG/ML IV SOLN
INTRAVENOUS | Status: DC | PRN
  Administered 2015-09-23: 50 mg via INTRAVENOUS

## 2015-09-23 MED ORDER — LIDOCAINE HCL (CARDIAC) 20 MG/ML IV SOLN
INTRAVENOUS | Status: AC
  Filled 2015-09-23: qty 5

## 2015-09-23 MED ORDER — HEMOSTATIC AGENTS (NO CHARGE) OPTIME
TOPICAL | Status: DC | PRN
  Administered 2015-09-23 (×2): 1 via TOPICAL

## 2015-09-23 MED ORDER — METHOCARBAMOL 500 MG PO TABS
500.0000 mg | ORAL_TABLET | Freq: Four times a day (QID) | ORAL | Status: DC | PRN
  Administered 2015-09-23 – 2015-09-24 (×2): 500 mg via ORAL
  Filled 2015-09-23 (×3): qty 1

## 2015-09-23 SURGICAL SUPPLY — 65 items
APL SKNCLS STERI-STRIP NONHPOA (GAUZE/BANDAGES/DRESSINGS) ×1
APPLIER CLIP 9.375 MED OPEN (MISCELLANEOUS)
APR CLP MED 9.3 20 MLT OPN (MISCELLANEOUS)
BENZOIN TINCTURE PRP APPL 2/3 (GAUZE/BANDAGES/DRESSINGS) ×3 IMPLANT
BINDER BREAST LRG (GAUZE/BANDAGES/DRESSINGS) IMPLANT
BINDER BREAST MEDIUM (GAUZE/BANDAGES/DRESSINGS) ×2 IMPLANT
BINDER BREAST XLRG (GAUZE/BANDAGES/DRESSINGS) IMPLANT
BIOPATCH RED 1 DISK 7.0 (GAUZE/BANDAGES/DRESSINGS) ×2 IMPLANT
BIOPATCH RED 1IN DISK 7.0MM (GAUZE/BANDAGES/DRESSINGS) ×2
CANISTER SUCTION 2500CC (MISCELLANEOUS) ×3 IMPLANT
CHLORAPREP W/TINT 26ML (MISCELLANEOUS) ×3 IMPLANT
CLIP APPLIE 9.375 MED OPEN (MISCELLANEOUS) IMPLANT
CLOSURE WOUND 1/2 X4 (GAUZE/BANDAGES/DRESSINGS) ×2
CONT SPEC 4OZ CLIKSEAL STRL BL (MISCELLANEOUS) ×3 IMPLANT
COVER PROBE W GEL 5X96 (DRAPES) ×3 IMPLANT
COVER SURGICAL LIGHT HANDLE (MISCELLANEOUS) ×3 IMPLANT
DEVICE DISSECT PLASMABLAD 3.0S (MISCELLANEOUS) IMPLANT
DRAIN CHANNEL 19F RND (DRAIN) ×3 IMPLANT
DRAPE LAPAROSCOPIC ABDOMINAL (DRAPES) ×3 IMPLANT
DRSG TEGADERM 2-3/8X2-3/4 SM (GAUZE/BANDAGES/DRESSINGS) ×4 IMPLANT
ELECT BLADE 4.0 EZ CLEAN MEGAD (MISCELLANEOUS) ×3
ELECT CAUTERY BLADE 6.4 (BLADE) ×3 IMPLANT
ELECT REM PT RETURN 9FT ADLT (ELECTROSURGICAL) ×3
ELECTRODE BLDE 4.0 EZ CLN MEGD (MISCELLANEOUS) ×1 IMPLANT
ELECTRODE REM PT RTRN 9FT ADLT (ELECTROSURGICAL) ×1 IMPLANT
EVACUATOR SILICONE 100CC (DRAIN) ×5 IMPLANT
GAUZE SPONGE 4X4 12PLY STRL (GAUZE/BANDAGES/DRESSINGS) ×3 IMPLANT
GLOVE BIO SURGEON STRL SZ 6 (GLOVE) ×2 IMPLANT
GLOVE BIO SURGEON STRL SZ7 (GLOVE) ×7 IMPLANT
GLOVE BIOGEL PI IND STRL 7.0 (GLOVE) IMPLANT
GLOVE BIOGEL PI IND STRL 7.5 (GLOVE) ×1 IMPLANT
GLOVE BIOGEL PI INDICATOR 7.0 (GLOVE) ×2
GLOVE BIOGEL PI INDICATOR 7.5 (GLOVE) ×2
GOWN STRL REUS W/ TWL LRG LVL3 (GOWN DISPOSABLE) ×3 IMPLANT
GOWN STRL REUS W/TWL LRG LVL3 (GOWN DISPOSABLE) ×9
HEMOSTAT ARISTA ABSORB 3G PWDR (MISCELLANEOUS) ×4 IMPLANT
KIT BASIN OR (CUSTOM PROCEDURE TRAY) ×3 IMPLANT
KIT ROOM TURNOVER OR (KITS) ×3 IMPLANT
LIQUID BAND (GAUZE/BANDAGES/DRESSINGS) ×4 IMPLANT
MARKER SKIN DUAL TIP RULER LAB (MISCELLANEOUS) ×5 IMPLANT
NDL 18GX1X1/2 (RX/OR ONLY) (NEEDLE) IMPLANT
NDL HYPO 25GX1X1/2 BEV (NEEDLE) IMPLANT
NEEDLE 18GX1X1/2 (RX/OR ONLY) (NEEDLE) IMPLANT
NEEDLE HYPO 25GX1X1/2 BEV (NEEDLE) IMPLANT
NS IRRIG 1000ML POUR BTL (IV SOLUTION) ×3 IMPLANT
PACK GENERAL/GYN (CUSTOM PROCEDURE TRAY) ×3 IMPLANT
PAD ARMBOARD 7.5X6 YLW CONV (MISCELLANEOUS) ×3 IMPLANT
PIN SAFETY STERILE (MISCELLANEOUS) ×3 IMPLANT
PLASMABLADE 3.0S (MISCELLANEOUS)
SPECIMEN JAR X LARGE (MISCELLANEOUS) ×3 IMPLANT
STAPLER VISISTAT 35W (STAPLE) ×3 IMPLANT
STRIP CLOSURE SKIN 1/2X4 (GAUZE/BANDAGES/DRESSINGS) ×3 IMPLANT
SUT ETHILON 2 0 FS 18 (SUTURE) ×5 IMPLANT
SUT MNCRL AB 4-0 PS2 18 (SUTURE) ×2 IMPLANT
SUT MON AB 4-0 PC3 18 (SUTURE) ×3 IMPLANT
SUT SILK 2 0 SH (SUTURE) IMPLANT
SUT VIC AB 3-0 54X BRD REEL (SUTURE) ×1 IMPLANT
SUT VIC AB 3-0 BRD 54 (SUTURE) ×3
SUT VIC AB 3-0 SH 18 (SUTURE) ×5 IMPLANT
SUT VIC AB 3-0 SH 8-18 (SUTURE) ×3 IMPLANT
SYR CONTROL 10ML LL (SYRINGE) IMPLANT
TOWEL OR 17X24 6PK STRL BLUE (TOWEL DISPOSABLE) ×3 IMPLANT
TOWEL OR 17X26 10 PK STRL BLUE (TOWEL DISPOSABLE) ×3 IMPLANT
TUBE CONNECTING 12'X1/4 (SUCTIONS)
TUBE CONNECTING 12X1/4 (SUCTIONS) IMPLANT

## 2015-09-23 NOTE — Interval H&P Note (Signed)
History and Physical Interval Note:  09/23/2015 11:20 AM  Gabriela [name]  has presented today for surgery, with the diagnosis of LEFT BREAST CANCER, FAMILY HISTORY OF BREAST CANCER  The various methods of treatment have been discussed with the patient and family. After consideration of risks, benefits and other options for treatment, the patient has consented to  Procedure(s): LEFT TOTAL MASTECTOMY [place], RIGHT PROPHYLACTIC TOTAL MASTECTOMY (Bilateral) as a surgical intervention .  The patient's history has been reviewed, patient examined, no change in status, stable for surgery.  I have reviewed the patient's chart and labs.  Questions were answered to the patient's satisfaction.     Gabriela [name]

## 2015-09-23 NOTE — Op Note (Addendum)
Preoperative diagnosis: Left breast cancer clinical stage I Postoperative diagnosis: Same as above Procedure: Left total mastectomy, Left axillary sentinel node biopsy, right prophylactic total mastectomy Surgeon: Dr. Serita Grammes Anesthesia: Gen. with bilateral pectoral block Specimens: Right breast tissue marked short superior and long stitch with axillary tail, left axillary sentinel node biopsy with highest count of 1761, left breast tissue marked short superior, long latear Complications: None Drains: 19 French Blake drain bilaterally Estimated blood loss: 75 mL Sponge count was correct at completion Disposition to recovery in stable condition  Indications: This is a 51 yof who has newly diagnosed left breast cancer. She elected to undergo left mastectomy with sn biopsy.  She also desires right prophylactic mastectomy.  She has elected not to undergo reconstruction.   Procedure: After informed consent was obtained she was taken to the operating room. She had a pectoral block bilaterally. She was injected with technetium in the standard periareolar fashion. She was administered antibiotics. Sequential compression devices were on her legs. She was then placed under general anesthesia without complication. Her bilateral chest and left axilla were then prepped and draped in the standard sterile surgical fashion. A surgical timeout was then performed.  I made an elliptical incision on the right side that encompassed the nipple areola and a fair amount of her skin. I then created flaps to near the clavicle, parasternal area, inframammary crease, and the latissimus laterally to encompass the breast tissue.  I then removed the breast tissue from the pectoralis muscle including the pectoralis fascia and rolled this laterally.Hemostasis was observed I then marked this as above. I passed this off the table as a specimen. I then obtained hemostasis. I irrigated.I placed Arista throughout the cavity.  A 19 Fr Blake drain was placed and was secured with a 2-0 nylon suture. I then closed the incision with a 3-0 Vicryl. I then closed all of the skin with a 4-0 Monocryl. I then placed glue and Steri-Strips.   I then made an elliptical incision on the left side that encompassed the nipple areola and a fair amount of her skin. I then created flaps to near  the clavicle, parasternal area, inframammary crease, and the latissimus laterally to encompass the breast tissue.  I then removed the breast tissue from the pectoralis muscle including the pectoralis fascia and rolled this laterally The tumor was well clear of the skin and the pectoralis. .Hemostasis was observed. I then entered into her axilla. I was able to identify a sentinel node with counts as above. Hemostasis was observed. I placed Arista in the cavity. I then placed a 19 Fr Blake drain and secured this with a 2-0 nylon suture.  I then closed the incision with a 3-0 vicryl suture and the skin with 4-0 monocryl.  I then placed glue and steristrips.  A breast binder was placed. She tolerated this well was extubated and transferred to recovery stable.

## 2015-09-23 NOTE — H&P (Signed)
45 yof who palpated a left breast mass recently. she has family history of mom at age 46 and mgm at age 26 with breast cancer. she refused to do mm. US shows a irregular hypoechoic mass that measures 1.6x1x1.1 cm. US of the axilla is negative. 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 she is here with her husband today   Other Problems Conni Slipper, RN; 09/15/2015 8:00 AM) Lump In Breast  Past Surgical History Conni Slipper, RN; 09/15/2015 8:00 AM) No pertinent past surgical history  Diagnostic Studies History Conni Slipper, RN; 09/15/2015 7:59 AM) Colonoscopy never Pap Smear 1-5 years ago  Medication History Conni Slipper, RN; 09/15/2015 8:00 AM) No Current Medications Medications Reconciled  Social History Conni Slipper, RN; 09/15/2015 8:00 AM) Alcohol use Recently quit alcohol use. Caffeine use Tea. No drug use Tobacco use Former smoker.  Family History Conni Slipper, RN; 09/15/2015 8:00 AM) Breast Cancer Family Members In General, Mother. Respiratory Condition Family Members In General.  Pregnancy / Birth History Conni Slipper, RN; 09/15/2015 8:00 AM) Age at menarche 77 years. Gravida 2 Irregular periods Maternal age 7-30 Para 2    Review of Systems Conni Slipper RN; 09/15/2015 8:00 AM) General Not Present- Appetite Loss, Chills, Fatigue, Fever, Night Sweats, Weight Gain and Weight Loss. Skin Not Present- Change in Wart/Mole, Dryness, Hives, Jaundice, New Lesions, Non-Healing Wounds, Rash and Ulcer. HEENT Not Present- Earache, Hearing Loss, Hoarseness, Nose Bleed, Oral Ulcers, Ringing in the Ears, Seasonal Allergies, Sinus Pain, Sore Throat, Visual Disturbances, Wears glasses/contact lenses and Yellow Eyes. Respiratory Not Present- Bloody sputum, Chronic Cough, Difficulty Breathing, Snoring and Wheezing. Breast Present- Breast Mass and Breast Pain. Not Present- Nipple Discharge and Skin  Changes. Cardiovascular Not Present- Chest Pain, Difficulty Breathing Lying Down, Leg Cramps, Palpitations, Rapid Heart Rate, Shortness of Breath and Swelling of Extremities. Gastrointestinal Not Present- Abdominal Pain, Bloating, Bloody Stool, Change in Bowel Habits, Chronic diarrhea, Constipation, Difficulty Swallowing, Excessive gas, Gets full quickly at meals, Hemorrhoids, Indigestion, Nausea, Rectal Pain and Vomiting. Female Genitourinary Not Present- Frequency, Nocturia, Painful Urination, Pelvic Pain and Urgency. Musculoskeletal Not Present- Back Pain, Joint Pain, Joint Stiffness, Muscle Pain, Muscle Weakness and Swelling of Extremities. Neurological Not Present- Decreased Memory, Fainting, Headaches, Numbness, Seizures, Tingling, Tremor, Trouble walking and Weakness. Psychiatric Not Present- Anxiety, Bipolar, Change in Sleep Pattern, Depression, Fearful and Frequent crying. Endocrine Present- Cold Intolerance. Not Present- Excessive Hunger, Hair Changes, Heat Intolerance, Hot flashes and New Diabetes. Hematology Not Present- Easy Bruising, Excessive bleeding, Gland problems, HIV and Persistent Infections.   Physical Exam Rolm Bookbinder MD; 09/15/2015 2:06 PM) General Mental Status-Alert. Orientation-Oriented X3.  Chest and Lung Exam Chest and lung exam reveals -on auscultation, normal breath sounds, no adventitious sounds and normal vocal resonance.  Breast Nipples-No Discharge.   Cardiovascular Cardiovascular examination reveals -normal heart sounds, regular rate and rhythm with no murmurs.  Lymphatic Head & Neck  General Head & Neck Lymphatics: Bilateral - Description - Normal. Axillary  General Axillary Region: Bilateral - Description - Normal. Note: no [place] adenopathy     Assessment & Plan Rolm Bookbinder MD; 09/15/2015 2:13 PM) BREAST CANCER OF UPPER-OUTER QUADRANT OF LEFT FEMALE BREAST (C50.412) Story: Bilateral total mastectomy with left ax sn  biopsy we had a very long discussion today about surgery. she is adamant after the conversation that without a genetic mutation I dont recommend bilateral mastectomy. I think on the left is reasonable  given her desire to avoid radiotherapy but this is still a chance. we discussed at length lumpectomy/radiation which I think she is candidate for as equivalent locally and systemically in terms of survivial to mastectomy. she understands that prophylactic in absence of genetic mutation is not necessary. due to her mothers cancer and fh she very strongly desires this although she is aware it is not 100% preventive and there is up to 5% risk of breast cancer after mastectomy. she does not want any reconstruction either. she has been hesitant to get mm but we have agreed that she will at least get a tomo view of the right breast prior to surgery. will then proceed with bilateral mastectomies. e discussed a sentinel lymph node biopsy as she does not appear to having lymph node involvement right now. We discussed the performance of that with injection of radioactive tracer and possibly blue dye. We discussed that she would have an incision underneath her axillary hairline or would be done through the same incision. We discussed that there is a chance of having a positive node with a sentinel lymph node biopsy and we will await the permanent pathology to make any other first further decisions in terms of her treatment. One of these options might be to return to the operating room to perform an axillary lymph node dissection. We discussed up to a 5% risk lifetime of chronic shoulder pain as well as lymphedema associated with a sentinel lymph node biopsy.

## 2015-09-23 NOTE — Transfer of Care (Signed)
Immediate Anesthesia Transfer of Care Note  Patient: Gabriela [name]  Procedure(s) Performed: Procedure(s): LEFT TOTAL MASTECTOMY WITH LEFT AXILLARY SENTINEL LYMPH NODE BIOPSY, RIGHT PROPHYLACTIC TOTAL MASTECTOMY (Bilateral)  Patient Location: PACU  Anesthesia Type:General  Level of Consciousness: awake, alert , oriented, patient cooperative and responds to stimulation  Airway & Oxygen Therapy: Patient Spontanous Breathing and Patient connected to nasal cannula oxygen  Post-op Assessment: Report given to RN, Post -op Vital signs reviewed and stable and Patient moving all extremities X 4  Post vital signs: Reviewed and stable  Last Vitals:  Filed Vitals:   09/23/15 1044  BP: 112/47  Pulse: 51  Temp: 36.8 C  Resp: 18    Complications: No apparent anesthesia complications

## 2015-09-24 ENCOUNTER — Encounter (HOSPITAL_COMMUNITY): Payer: Self-pay | Admitting: General Surgery

## 2015-09-24 DIAGNOSIS — C50412 Malignant neoplasm of upper-outer quadrant of left female breast: Secondary | ICD-10-CM | POA: Diagnosis not present

## 2015-09-24 MED ORDER — TRAMADOL HCL 50 MG PO TABS: 50.0000 mg | ORAL_TABLET | Freq: Four times a day (QID) | ORAL | Status: DC | PRN

## 2015-09-24 MED ORDER — TRAMADOL HCL 50 MG PO TABS: 100.0000 mg | ORAL_TABLET | Freq: Two times a day (BID) | ORAL | Status: DC | PRN

## 2015-09-24 MED ORDER — METHOCARBAMOL 500 MG PO TABS: 500.0000 mg | ORAL_TABLET | Freq: Four times a day (QID) | ORAL | Status: DC | PRN

## 2015-09-24 NOTE — Progress Notes (Signed)
Pt requested only  half dose (equal to1mg ) Morphine for pain, pt refused to take 2 mg dose because it might be too strong for her. Wasted x2 1mg  dose(given at 65 and 2154)witnessed by Paulo Fruit

## 2015-09-24 NOTE — Discharge Instructions (Signed)
[facility] [place] °[phone] ° °MASTECTOMY: POST OP INSTRUCTIONS ° °Always review your discharge instruction sheet given to you by the facility where your surgery was performed. °IF YOU HAVE DISABILITY OR FAMILY LEAVE FORMS, YOU MUST BRING THEM TO THE OFFICE FOR PROCESSING.   °DO NOT GIVE THEM TO YOUR DOCTOR. °A prescription for pain medication may be given to you upon discharge.  Take your pain medication as prescribed, if needed.  If narcotic pain medicine is not needed, then you may take acetaminophen (Tylenol), naprosyn (Alleve) or ibuprofen (Advil) as needed. °1. Take your usually prescribed medications unless otherwise directed. °2. If you need a refill on your pain medication, please contact your pharmacy.  They will contact our office to request authorization.  Prescriptions will not be filled after 5pm or on week-ends. °3. You should follow a light diet the first few days after arrival home, such as soup and crackers, etc.  Resume your normal diet the day after surgery. °4. Most patients will experience some swelling and bruising on the chest and underarm.  Ice packs will help.  Swelling and bruising can take several days to resolve. Wear the binder day and night until you return to the office.  °5. It is common to experience some constipation if taking pain medication after surgery.  Increasing fluid intake and taking a stool softener (such as Colace) will usually help or prevent this problem from occurring.  A mild laxative (Milk of Magnesia or Miralax) should be taken according to package instructions if there are no bowel movements after 48 hours. °6. Unless discharge instructions indicate otherwise, leave your bandage dry and in place until your next appointment in 3-5 days.  You may take a limited sponge bath.  No tube baths or showers until the drains are removed.  You may have steri-strips (small skin tapes) in place directly over the incision.  These strips should be left on the  skin for 7-10 days. If you have glue it will come off in next couple week.  Any sutures will be removed at an office visit °7. DRAINS:  If you have drains in place, it is important to keep a list of the amount of drainage produced each day in your drains.  Before leaving the hospital, you should be instructed on drain care.  Call our office if you have any questions about your drains. I will remove your drains when they put out less than 30 cc or ml for 2 consecutive days. °8. ACTIVITIES:  You may resume regular (light) daily activities beginning the next day--such as daily self-care, walking, climbing stairs--gradually increasing activities as tolerated.  You may have sexual intercourse when it is comfortable.  Refrain from any heavy lifting or straining until approved by your doctor. °a. You may drive when you are no longer taking prescription pain medication, you can comfortably wear a seatbelt, and you can safely maneuver your car and apply brakes. °b. RETURN TO WORK:  __________________________________________________________ °9. You should see your doctor in the office for a follow-up appointment approximately 3-5 days after your surgery.  Your doctor’s nurse will typically make your follow-up appointment when she calls you with your pathology report.  Expect your pathology report 3-4business days after surgery. °10. OTHER INSTRUCTIONS: ______________________________________________________________________________________________ ____________________________________________________________________________________________ °WHEN TO CALL YOUR DR Jizel Cheeks: °1. Fever over 101.0 °2. Nausea and/or vomiting °3. Extreme swelling or bruising °4. Continued bleeding from incision. °5. Increased pain, redness, or drainage from the incision. °The clinic staff is available   to answer your questions during regular business hours.  Please don’t hesitate to call and ask to speak to one of the nurses for clinical concerns.  If  you have a medical emergency, go to the nearest emergency room or call 911.  A surgeon from [facility] is always on call at the hospital. °[address], [address], [place], [place]  [address] ? [address] [address], [place], [place]   [address] °[phone] ? [phone] ? FAX [phone] °Web site: [email or web address] ° °

## 2015-09-24 NOTE — Progress Notes (Signed)
1 Day Post-Op  Subjective: Some dizziness with narcotics, emesis with narcotics, wants to go home, tolerating liquids  Objective: Vital signs in last 24 hours: Temp:  [97.8 F (36.6 C)-99.1 F (37.3 C)] 98 F (36.7 C) (03/31 0620) Pulse Rate:  [44-91] 52 (03/31 0620) Resp:  [9-18] 18 (03/31 0620) BP: (93-113)/(47-69) 107/55 mmHg (03/31 0620) SpO2:  [89 %-100 %] 98 % (03/31 0620) Weight:  [54.942 kg (121 lb 2 oz)] 54.942 kg (121 lb 2 oz) (03/30 1036) Last BM Date: 09/22/15  Intake/Output from previous day: 03/30 0701 - 03/31 0700 In: 2440 [P.O.:240; I.V.:2200] Out: 1322 [Urine:900; Drains:372; Blood:50] Intake/Output this shift: Total I/O In: -  Out: 1160 [Urine:900; Drains:260]  Breasts: normal appearance, no masses or tenderness, flaps viable bilaterally, no hematoma, drains as expected   Studies/Results: Nm Sentinel Node Inj-no Rpt (breast)  09/23/2015  CLINICAL DATA: left axillary sn biopsy Sulfur colloid was injected intradermally by the nuclear medicine technologist for breast cancer sentinel node localization.    Anti-infectives: Anti-infectives    Start     Dose/Rate Route Frequency Ordered Stop   09/23/15 1130  ceFAZolin (ANCEF) IVPB 2g/100 mL premix     2 g 200 mL/hr over 30 Minutes Intravenous To [facility] 09/22/15 1056 09/23/15 1216      Assessment/Plan: POD 1 bilateral total mastectomy, left ax sn biopsy 1. Tramadol, robaxin for pain 2. oob today, can go home if does well 3. Path pending  [facility] 09/24/2015

## 2015-09-24 NOTE — Progress Notes (Signed)
Patient discharged to home with instructions. 

## 2015-09-28 ENCOUNTER — Encounter: Payer: Self-pay | Admitting: *Deleted

## 2015-09-28 NOTE — Progress Notes (Signed)
Ordered oncotype per Dr. Feng.  Faxed requisition to pathology.   

## 2015-10-01 NOTE — Discharge Summary (Signed)
Physician Discharge Summary  Patient ID: Gabriela [name] MRN: [id number] DOB/AGE: [birth year] 45 y.o.  Admit date: 09/23/2015 Discharge date: 10/01/2015  Admission Diagnoses: Breast cancer  Discharge Diagnoses:  Active Problems:   Breast cancer [name])   Discharged Condition: good  Hospital Course: 45 yo healthy female underwent bilateral total mastectomies with left axillary sn biopsy for breast cancer. She did well overnight and will be discharged home.   Consults: None  Significant Diagnostic Studies: none  Treatments: surgery: see above    Disposition: 01-Home or Self Care     Medication List    TAKE these medications        b complex vitamins tablet  Take 1 tablet by mouth daily.     cholecalciferol 1000 units tablet  Commonly known as:  VITAMIN D  Take 2,000 Units by mouth daily.     Lysine 1000 MG Tabs  Take 1,000 mg by mouth as needed (for cold sores).     methocarbamol 500 MG tablet  Commonly known as:  ROBAXIN  Take 1 tablet (500 mg total) by mouth every 6 (six) hours as needed for muscle spasms.     OMEGA 3 PO  Take 2 tablets by mouth daily.     traMADol 50 MG tablet  Commonly known as:  ULTRAM  Take 1 tablet (50 mg total) by mouth every 6 (six) hours as needed for moderate pain.           Follow-up Information    Follow up with [facility], [place] In 1 week.   Specialty:  General Surgery   Contact information:   [address] [place] [address] [phone]       Signed: Rolm Bookbinder 10/01/2015, 9:49 AM

## 2015-10-06 NOTE — Anesthesia Postprocedure Evaluation (Signed)
Anesthesia Post Note  Patient: Gabriela [name]  Procedure(s) Performed: Procedure(s) (LRB): LEFT TOTAL MASTECTOMY WITH LEFT AXILLARY SENTINEL LYMPH NODE BIOPSY, RIGHT PROPHYLACTIC TOTAL MASTECTOMY (Bilateral)  Patient location during evaluation: PACU Anesthesia Type: General Level of consciousness: awake and alert Pain management: pain level controlled Vital Signs Assessment: post-procedure vital signs reviewed and stable Respiratory status: spontaneous breathing, nonlabored ventilation, respiratory function stable and patient connected to nasal cannula oxygen Cardiovascular status: blood pressure returned to baseline and stable Postop Assessment: no signs of nausea or vomiting Anesthetic complications: no    Last Vitals:  Filed Vitals:   09/24/15 0240 09/24/15 0620  BP: 99/59 107/55  Pulse: 61 52  Temp: 37.3 C 36.7 C  Resp: 18 18    Last Pain:  Filed Vitals:   09/24/15 0849  PainSc: 2                  Isabella Roemmich EDWARD

## 2015-10-07 ENCOUNTER — Encounter (HOSPITAL_COMMUNITY): Payer: Self-pay

## 2015-10-08 ENCOUNTER — Telehealth: Payer: Self-pay | Admitting: *Deleted

## 2015-10-08 NOTE — Telephone Encounter (Signed)
Spoke with patient to give her results of her oncotype (7/6%).  She is extremely happy with this news.  I will inform the team.  Patient is aware of her appointment with Dr. Burr Medico on 4/17.  Encouraged her to call with any needs or concerns.

## 2015-10-11 ENCOUNTER — Encounter: Payer: Self-pay | Admitting: Hematology

## 2015-10-11 ENCOUNTER — Ambulatory Visit (HOSPITAL_BASED_OUTPATIENT_CLINIC_OR_DEPARTMENT_OTHER): Payer: No Typology Code available for payment source | Admitting: Hematology

## 2015-10-11 ENCOUNTER — Telehealth: Payer: Self-pay | Admitting: Hematology

## 2015-10-11 VITALS — BP 97/60 | HR 63 | Temp 98.3°F | Resp 20 | Ht 68.0 in | Wt 125.6 lb

## 2015-10-11 DIAGNOSIS — Z170 Estrogen receptor positive status [ER+]: Secondary | ICD-10-CM

## 2015-10-11 DIAGNOSIS — C50212 Malignant neoplasm of upper-inner quadrant of left female breast: Secondary | ICD-10-CM | POA: Diagnosis not present

## 2015-10-11 NOTE — Progress Notes (Signed)
[name]  [phone] [phone]  Clinic Follow up Note   Patient Care Team: Carlena Sax, MD as PCP - General (Internal Medicine) Sylvan Cheese, NP as Nurse Practitioner (Hematology and Oncology) 10/11/2015  CHIEF COMPLAINTS:  Follow up left breast cancer  Oncology History   Breast cancer of upper-inner quadrant of left female breast [facility])   Staging form: Breast, AJCC 7th Edition     Clinical stage from 09/06/2015: Stage IA (T1c, N0, M0) - Signed by Truitt Merle, MD on 09/14/2015     Pathologic stage from 09/23/2015: Stage IA (T1c, N0, cM0) - Signed by Truitt Merle, MD on 10/11/2015        Breast cancer of upper-inner quadrant of left female breast ([place])   08/31/2015 Imaging Pt declined mammogram, [place] of left breast showed a 1.6 x 1 x 1.1 cm hypoechoic mass within the left breast at the 10:00 position. Axilla nodes appear to be normal.   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   09/06/2015 Receptors her2 ER 100% positive, PR 100% positive, HER-2 negative, Ki-67 10%   09/23/2015 Surgery  bilateral mastectomy and left sentinel lymph node biopsy.   09/23/2015 Pathology Results  Left breast mastectomy showed invasive and in situ carcinoma, 1.8 cm, invasive carcinoma focally involves the anterior margin, 6 lymph nodes were negative.   09/23/2015 Oncotype testing Oncotype DX RS: 7, low risk, which predicts 10 year distant recurrence risk of 6% with tamoxifen alone.    HISTORY OF PRESENTING ILLNESS (09/14/2015):  Gabriela [name] 45 y.o. female is here because of Her newly diagnosed breast cancer. She is accompanied by her husband to our multidisciplinary clinic today.  She palpated a mass in left breast 2 months ago, no tenderness, skin or nipple change, also  noticed intermittent palpitation, no chest paijn , dyspnea or other symptoms, she has good appetite and energy level, no weight loss. She was referred to [facility] him a and underwent a targeted ultrasound of left breast, which showed a 1.6 cm hypoechoic mass within the left breast at the 10 clock position, axillar was negative. She declined mammogram. She underwent ultrasound-guided left breast mass biopsy, which showed invasive ductal carcinoma, ER/PR positive, HER-2 negative.  Her mother died of recurrent metastatic breast cancer. She has decided to proceed with bilateral mastectomy.  GYN HISTORY  Menarchal: 12 LMP: 08/25/2015  Contraceptive: no  HRT: n/a  G2P2: 15 and 62 yo sons   CURRENT THERAPY: Pending adjuvant therapy  INTERIM HISTORY: Mrs. Gabriela [name] returns for follow-up. She underwent bilateral mastectomy and left sentinel lymph node biopsy on 09/23/2015. She tolerated surgery very well, has minimal pain at the incisions, and not taking any pain medication. She did have sharp and shooting pain at the left axilla incision a week ago, which resolved on its own after worse. No significant lymphedema, the range of bilateral shoulder are slightly limited to, but she is able to function very well. She denies any other new symptoms.     MEDICAL HISTORY:  Past Medical History  Diagnosis Date  . Depression   . Breast cancer of upper-inner quadrant of left female breast ([place]) 09/09/2015  . Anemia     "mild" (09/23/2015)  . Chronic left shoulder pain     SURGICAL HISTORY: Past Surgical History  Procedure Laterality Date  .  Wisdom tooth extraction    . Mastectomy complete / simple w/ sentinel node biopsy Left 09/23/2015    AXILLARY SENTINEL LYMPH NODE BIOPSY  . Mastectomy complete / simple Right 09/23/2015    PROPHYLACTIC   . Tonsillectomy    . Breast surgery Left 2017  . Mastectomy w/ sentinel node biopsy Bilateral 09/23/2015    Procedure: LEFT TOTAL MASTECTOMY WITH  LEFT AXILLARY SENTINEL LYMPH NODE BIOPSY, RIGHT PROPHYLACTIC TOTAL MASTECTOMY;  Surgeon: Rolm Bookbinder, MD;  Location: [facility];  Service: General;  Laterality: Bilateral;    SOCIAL HISTORY: Social History   Social History  . Marital Status: Married    Spouse Name: N/A  . Number of Children: N/A  . Years of Education: N/A   Occupational History  . Not on file.   Social History Main Topics  . Smoking status: Former Smoker -- 1.00 packs/day for 10 years    Types: Cigarettes    Quit date: 06/27/1995  . Smokeless tobacco: Never Used  . Alcohol Use: Yes     Comment: 09/23/2015 was 3 glasses of wine/wk; nothing in the last month"  . Drug Use: No  . Sexual Activity: Not Currently   Other Topics Concern  . 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    FAMILY HISTORY: Family History  Problem Relation Age of Onset  . Cancer Mother   . Breast cancer Mother   . Breast cancer Maternal Grandmother   . Lung cancer Maternal Grandfather     ALLERGIES:  is allergic to sulfa antibiotics.  MEDICATIONS:  Current Outpatient Prescriptions  Medication Sig Dispense Refill  . b complex vitamins tablet Take 1 tablet by mouth daily.    . cholecalciferol (VITAMIN D) 1000 units tablet Take 2,000 Units by mouth daily.    . Omega-3 Fatty Acids (OMEGA 3 PO) Take 2 tablets by mouth daily.     No current facility-administered medications for this visit.    REVIEW OF SYSTEMS:   Constitutional: Denies fevers, chills or abnormal night sweats Eyes: Denies blurriness of vision, double vision or watery eyes Ears, nose, mouth, throat, and face: Denies mucositis or sore throat Respiratory: Denies cough, dyspnea or wheezes Cardiovascular: Denies palpitation, chest discomfort or lower extremity swelling Gastrointestinal:  Denies nausea, heartburn or change in bowel habits Skin: Denies abnormal skin  rashes Lymphatics: Denies new lymphadenopathy or easy bruising Neurological:Denies numbness, tingling or new weaknesses Behavioral/Psych: Mood is stable, no new changes  All other systems were reviewed with the patient and are negative.  PHYSICAL EXAMINATION: ECOG PERFORMANCE STATUS: 0 - Asymptomatic  Filed Vitals:   10/11/15 1414  BP: 97/60  Pulse: 63  Temp: 98.3 F (36.8 C)  Resp: 20   Filed Weights   10/11/15 1414  Weight: 125 lb 9.6 oz (56.972 kg)    GENERAL:alert, no distress and comfortable SKIN: skin color, texture, turgor are normal, no rashes or significant lesions EYES: normal, conjunctiva are pink and non-injected, sclera clear OROPHARYNX:no exudate, no erythema and lips, buccal mucosa, and tongue normal  NECK: supple, thyroid normal size, non-tender, without nodularity LYMPH:  no palpable lymphadenopathy in the cervical, axillary or inguinal LUNGS: clear to auscultation and percussion with normal breathing effort HEART: regular rate & rhythm and no murmurs and no lower extremity edema ABDOMEN:abdomen soft, non-tender and normal bowel sounds Musculoskeletal:no cyanosis of digits and no clubbing  PSYCH: alert &  oriented x 3 with fluent speech NEURO: no focal motor/sensory deficits Breasts: status post bilateral mastectomy. Surgical incisions are healing well, she still has a drainage tube on the left chest wall, palpitation of the chest wall and both axilla reveal no palpable mass or adenopathy.    LABORATORY DATA:  I have reviewed the data as listed Lab Results  Component Value Date   WBC 7.8 09/20/2015   HGB 11.5* 09/20/2015   HCT 34.6* 09/20/2015   MCV 89.2 09/20/2015   PLT 242 09/20/2015    Recent Labs  09/15/15 0917 09/20/15 1559  NA 141 138  K 3.7 3.7  CL  --  105  CO2 27 23  GLUCOSE 77 88  BUN 14.2 9  CREATININE 0.9 0.88  CALCIUM 9.4 9.2  GFRNONAA  --  >60  GFRAA  --  >60  PROT 7.5  --   ALBUMIN 4.3  --   AST 19  --   ALT 10  --     ALKPHOS 53  --   BILITOT 0.81  --     PATHOLOGY REPORT  Diagnosis 09/23/2015 1. Breast, simple mastectomy, Right Breast Prophylactic Total Mastectomy, long stitch lateral, short stitch superior - FIBROCYSTIC CHANGES WITH CALCIFICATIONS. - NO EVIDENCE OF MALIGNANCY. 2. Breast, simple mastectomy, Left Breast Mastectomy for Cancer - INVASIVE AND IN SITU DUCTAL CARCINOMA, 1.8 CM. - INVASIVE CARCINOMA FOCALLY INVOLVES ANTERIOR MARGIN. - INVASIVE AND IN SITU CARCINOMA FOCALLY 0.2 CM FROM POSTERIOR MARGIN. - FIBROCYSTIC CHANGES. - BIOPSY SITE CHANGES. 3. Lymph node, sentinel, biopsy, Left Axillary - ONE BENIGN LYMPH NODE (0/1). 4. Lymph node, sentinel, biopsy, SLN - ONE BENIGN LYMPH NODE (0/1). 5. Lymph node, sentinel, biopsy, SLN - ONE BENIGN LYMPH NODE (0/1). 6. Lymph node, sentinel, biopsy, SLN - ONE BENIGN LYMPH NODE (0/1). 7. Lymph node, sentinel, biopsy, SLN - ONE BENIGN LYMPH NODE (0/1). 8. Lymph node, sentinel, biopsy, SLN - ONE BENIGN LYMPH NODE (0/1). Microscopic Comment 2. BREAST, INVASIVE TUMOR, WITH LYMPH NODES PRESENT Specimen, including laterality and lymph node sampling (sentinel, non-sentinel): Left breast and five sentinel lymph nodes. Procedure: Mastectomy and sentinel lymph node biopsies. Histologic type: Ductal. Grade: 1 Tubule formation: 2 Nuclear pleomorphism: 2 Mitotic:1 Tumor size (gross measurement): 1.8 cm Margins: Focally involved by invasive carcinoma. Invasive, distance to closest margin: Anterior margin focally involved. Posterior margin is 0.2 cm. In-situ, distance to closest margin: Less than 0.1 cm from anterior margin and 0.2 cm from posterior margin. If margin positive, focally or broadly: Focally. Lymphovascular invasion: No Ductal carcinoma in situ: Present. Grade: Intermediate. Extensive intraductal component: No Lobular neoplasia: No Tumor focality: Unifocal. Treatment effect: No If present, treatment effect in breast tissue, lymph  nodes or both: N/A Extent of tumor: Skin: Free of tumor Nipple: N/A Skeletal muscle: N/A Lymph nodes: Examined: 5 Sentinel 0 Non-sentinel 5 Total Lymph nodes with metastasis: 0 Isolated tumor cells (< 0.2 mm): 0 Micrometastasis: (> 0.2 mm and < 2.0 mm): 0 Macrometastasis: (> 2.0 mm): 0 Extracapsular extension: N/A Breast prognostic profile: Case number [id number] Estrogen receptor: 100%, positive, moderate staining. Progesterone receptor: 100%, positive, strong staining. Her 2 neu: Negative, ratio 1.43. Will be repeated on current specimen. Ki-67: 10% Non-neoplastic breast: Fibrocystic changes. TNM: pT1c, pN0, pMX (JDP:ecj 09/27/2015)  Results: HER2 - NEGATIVE RATIO OF HER2/CEP17 SIGNALS 1.20 AVERAGE HER2 COPY NUMBER PER CELL 1.80 Reference Range: NEGATIVE HER2/CEP17 Ratio <2.0 and average HER2 copy number <4.0 EQUIVOCAL HER2/CEP17 Ratio <2.0 and average HER2 copy number >=4.0 and <6.0 POSITIVE  HER2/CEP17 Ratio >=2.0 or <2.0 and average HER2 copy number >=6.0  Oncotype DX RS: 7, low risk, which predicts 10 year distant recurrence risk of 6% with tamoxifen alone.  RADIOGRAPHIC STUDIES: I have personally reviewed the radiological images as listed and agreed with the findings in the report.  US Breast Ltd Uni Left Inc Axilla 08/31/2015  ADDENDUM REPORT: 08/31/2015 10:00 ADDENDUM: The patient presented for ultrasound guided biopsy of the left breast today. She felt anxious about the procedure and was hesitant about placing the biopsy marking clip at the end of the procedure. The purpose of the clip was explained to the patient, and the clip was shown to the patient. Ultimately she agreed to the clip, but would like to reschedule to allow her to take Ativan prior to the procedure. She will be rescheduled for Monday 09/06/2015 at 11 am. I advised her to arrive 45 minutes prior to the procedure so that she can be consented prior to taking the Ativan, and advised her that she will need a  driver to accompany her. Today, the left axilla was imaged by ultrasound demonstrating multiple normal appearing lymph nodes. Electronically Signed   By: Ammie Ferrier M.D.   On: 08/31/2015 10:00  08/25/2015  ADDENDUM REPORT: 08/25/2015 07:55 ADDENDUM: Both prior to the exam and during the exam, there was extensive conversation about the need for mammography in order to fully evaluate this palpable abnormality and to exclude synchronous lesion. Patient repeatedly declined mammography. Patient indicated an understanding that performing an ultrasound only is not standard of care and may limit her evaluation. Electronically Signed   By: Franki Cabot M.D.   On: 08/25/2015 07:55  08/31/2015  CLINICAL DATA:  Patient presents with a palpable abnormality in the upper left breast. Family history of breast cancer (mother). Patient declines mammogram. EXAM: ULTRASOUND OF THE LEFT BREAST COMPARISON:  No prior exams. FINDINGS: On physical exam, there is a palpable mass within the upper left breast. Targeted ultrasound is performed, showing an irregular hypoechoic mass within the left breast at the 10 o'clock axis, 5 cm from the nipple, with some internal vascularity, measuring 1.6 x 1 x 1.1 cm, corresponding to the palpable abnormality. IMPRESSION: Irregular hypoechoic mass within the left breast at the 10 o'clock axis, 5 cm from the nipple, measuring 1.6 x 1 x 1.1 cm, corresponding to a palpable abnormality. This is a highly suspicious finding for which ultrasound-guided biopsy is recommended. During the ultrasound exam, mammography was again recommended to the patient for more definitive characterization of the left breast mass and to exclude synchronous lesions. Patient again declined mammography. RECOMMENDATION: Ultrasound-guided biopsy of the left breast mass. Patient will meet with [facility] prior to scheduling the procedure. Axillary ultrasound was not performed today. Recommend ultrasound evaluation of the left axilla  at the time of the ultrasound-guided biopsy. I have discussed the findings and recommendations with the patient. Results were also provided in writing at the conclusion of the visit. If applicable, a reminder letter will be sent to the patient regarding the next appointment. BI-RADS CATEGORY  4: Suspicious abnormality - biopsy should be considered. Electronically Signed: By: Franki Cabot M.D. On: 08/24/2015 18:07   [place] Lt Breast Bx W Loc Dev 1st Lesion Img Bx Spec US Guide    ASSESSMENT & PLAN: 45 year old premenopausal woman, family history of breast cancer in her mother, presented with a palpable left breast mass.  1. 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 -She has had early stage breast cancer, low-grade, ER+/PR+/HER2-, her risk of recurrence is low (6% 10 year distant recurrence risk with tamoxifen), she likely will do very well. -Due to the positive anterior margin, she will likely need adjuvant irradiation, or second surgery. She is scheduled to see Dr. Donne Hazel back today.  -I discussed the Oncotype DX test results. She has low risk disease, no benefit of adjuvant chemotherapy. She is quite happy with the result. -Given her strong ER/PR positivity (100%), premenopausal status, I strongly recommend adjuvant tamoxifen. The other option of ovarian suppression and aromatase inhibitor was discussed with her also. Potential side effects of this antiestrogen therapy were reviewed in great details with her. Giving her low-grade and early stage disease, I do not feel overt suppression and aromatase inhibitor will give her much more additional benefit, I recommend tamoxifen for total of 10 years. -The potential side effects from tamoxifen, which includes but not limited to, hot flash, skin and vaginal dryness, slightly increased risk of  cardiovascular disease and cataract, small risk of thrombosis and endometrial cancer, were discussed with her in great details. Preventive strategies for thrombosis, such as being physically active, using compression stocks, avoid cigarette smoking, etc., were reviewed with her. I also recommend her to follow-up with her gynecologist once a year, and watch for vaginal spotting or bleeding, as a clinically sign of endometrial cancer, etc. She voiced good understanding, and agrees to proceed. Will start after she completes adjuvant radiation. --e also reviewed the breast cancer surveillance, including self exam, routine follow up with physical exam and lab. She has had a bilateral mastectomy, no role for surveillance mammogram.  2. Genetics -Due to her young age and family history of breast cancer, I have referred her to Dietitian. She is scheduled for 4/25.   Plan -She will see Dr. Donne Hazel back today, likely proceed with adjuvant radiation if no more surgery for positive anterior margin -I'll see her back in 6 weeks, or towards the end of her radiation.    All questions were answered. The patient knows to call the clinic with any problems, questions or concerns. I spent 25 minutes counseling the patient face to face. The total time spent in the appointment was 30 minutes and more than 50% was on counseling.     Truitt Merle, MD 10/11/2015 6:20 PM

## 2015-10-11 NOTE — Telephone Encounter (Signed)
Gave and printed appt sched and avs for pt for may °

## 2015-10-13 ENCOUNTER — Ambulatory Visit
Admission: RE | Admit: 2015-10-13 | Discharge: 2015-10-13 | Disposition: A | Discharge status: Home / Self Care | Payer: No Typology Code available for payment source | Source: Ambulatory Visit | Attending: Radiation Oncology | Admitting: Radiation Oncology

## 2015-10-13 ENCOUNTER — Encounter: Payer: Self-pay | Admitting: Radiation Oncology

## 2015-10-13 VITALS — BP 103/46 | HR 60 | Temp 98.1°F | Resp 16 | Ht 68.0 in | Wt 126.2 lb

## 2015-10-13 DIAGNOSIS — C50212 Malignant neoplasm of upper-inner quadrant of left female breast: Secondary | ICD-10-CM

## 2015-10-13 NOTE — Progress Notes (Signed)
Department of Radiation Oncology  Phone:  [phone] Fax:        [phone]   Name: Gabriela [name] MRN: [id number]  DOB: 1971-03-13  Date: 10/13/2015  Follow Up Visit Note  Diagnosis: Breast cancer of upper-inner quadrant of left female breast [facility])   Staging form: Breast, AJCC 7th Edition     Clinical stage from 09/06/2015: Stage IA (T1c, N0, M0) - Signed by Truitt Merle, MD on 09/14/2015     Pathologic stage from 09/23/2015: Stage IA (T1c, N0, cM0) - Signed by Truitt Merle, MD on 10/11/2015  Prior radiation: No  Interval History: Gabriela [name] presents today for followup prior to radiation treatment. I initially saw her in Multidisciplinary breast clinic on 09/15/2015. She had her left breast mastectomy on 09/23/2015. This showed invasive and in situ ductal carcinoma measuring 1.8 cm. It was revealed that invasive carcinoma focally involves anterior margin. Invasive and in situ carcinoma were found to be focally 0.2 cm from posterior margin. Six lymph nodes were benign. She has discussed re-excision with Dr. Donne Hazel and would like to discuss radiation before proceeding forward. She has discussed tamoxifen with Dr. Burr Medico and has a prescription for this. She is accompanied by her husband today.   She denies any lymphedema issues. She reports pain in the left breast at a 3/10 and has a [place] pratt drain still in place.  She is concerned about her drain shifting.  She has an appointment with Dr. Donne Hazel tomorrow morning but asked that I take a look at it.   Physical Exam:  Filed Vitals:   10/13/15 1402  BP: 103/46  Pulse: 60  Temp: 98.1 F (36.7 C)  TempSrc: Oral  Resp: 16  Height: 5\' 8"  (1.727 m)  Weight: 126 lb 3.2 oz (57.244 kg)  SpO2: 100%   This is a well-appearing female in no acute distress. She is alert and oriented. She is status post bilateral mastectomies with her wounds healing well and surgical glue in place with no signs of infection or recurrent disease. The left sided  drain appears to be in place with a small amount of serosanginous drainage in the bulb.   IMPRESSION: Gabriela [name] is a 45 y.o female with Stage IA (T1c, N0, M0) breast cancer of the upper-inner quadrant of the left female breast s/p bilateral mastectomies with a positive anterior (skin) margin.   PLAN:  We spent about 45 minutes discussing her surgery and the meaning of this positive margin.  Dr. Donne Hazel is sure he can localize this area dn re-excise which is what I have recommended to her.  We discussed that radiation is not effective in "clearing" positive margins and is not an equivalent treatment option to surgery if she does have cancer cells left behind. We discussed the processing of surgical specimens and how it it is possible that she does not have cancer cells left behind and they were all in fact removed at the time of surgery.  We discussed the difference between systemic and local therapy. We discussed what margins mean.  We discussed the logistics of radiation and the treatment of her chest wall. I discussed with her that often times treatment of lymph nodes is recommended int he post mastectomy setting. I think this is a special circumstance where treatment of the draining lymphatics would not be indicated.  We discussed salvage options if she did have a chest wall failure and the necessity of a tissue based reconstruction (if that is possible) after radiation not implant  based reconstruction.    ------------------------------------------------  Thea Silversmith, MD This document serves as a record of services personally performed by Thea Silversmith, MD. It was created on her behalf by Jenell Milliner, a trained medical scribe. The creation of this record is based on the scribe's personal observations and the provider's statements to them. This document has been checked and approved by the attending provider.

## 2015-10-13 NOTE — Progress Notes (Signed)
Location of Breast Cancer:Upper-inner quadrant of left female breast  Histology per Pathology Report: Diagnosis 09-23-15 1. Breast, simple mastectomy, Right Breast Prophylactic Total Mastectomy, long stitch lateral, short stitch superior - FIBROCYSTIC CHANGES WITH CALCIFICATIONS. - NO EVIDENCE OF MALIGNANCY. 2. Breast, simple mastectomy, Left Breast Mastectomy for Cancer - INVASIVE AND IN SITU DUCTAL CARCINOMA, 1.8 CM. - INVASIVE CARCINOMA FOCALLY INVOLVES ANTERIOR MARGIN. - INVASIVE AND IN SITU CARCINOMA FOCALLY 0.2 CM FROM POSTERIOR MARGIN. - FIBROCYSTIC CHANGES. - BIOPSY SITE CHANGES. 3. Lymph node, sentinel, biopsy, Left Axillary - ONE BENIGN LYMPH NODE (0/1). 1 of 4  Diagnosis(continued) 4. Lymph node, sentinel, biopsy, SLN - ONE BENIGN LYMPH NODE (0/1). 5. Lymph node, sentinel, biopsy, SLN - ONE BENIGN LYMPH NODE (0/1). 6. Lymph node, sentinel, biopsy, SLN - ONE BENIGN LYMPH NODE (0/1). 7. Lymph node, sentinel, biopsy, SLN - ONE BENIGN LYMPH NODE (0/1). 8. Lymph node, sentinel, biopsy, SLN - ONE BENIGN LYMPH NODE (0/1)  Receptor Status: ER(100%), PR (100%), Her2-neu (-), Ki-(10%)  Did patient present with symptoms (if so, please note symptoms) or was this found on screening mammography?: Gabriela [name] palpated a mass two months ago.  Past/Anticipated interventions by surgeon, if any:U/S biopsy left breast   Past/Anticipated interventions by medical oncology, if any: Genetics,Oncotype testing,adjuvant radiation   Lymphedema issues, if any:No     Pain issues, if any: 3/10 Left breast has a Tree surgeon pratt  SAFETY ISSUES:  Prior radiation? :No  Pacemaker/ICD?:No  Possible current pregnancy?:No  Is the patient on methotrexate?:No  Current Complaints / other details: Menarche 70, G2,P2, Menopause N/A, BC N/A, HRT No  Breast cancer mother, maternal grandmother, maternal grandfather lung cancer  BP 103/46 mmHg  Pulse 60  Temp(Src) 98.1 F (36.7 C) (Oral)  Resp 16   Ht _0  (1.727 m)  Wt 126 lb 3.2 oz (57.244 kg)  BMI 19.19 kg/m2  SpO2 100%  LMP 08/30/2015 Georgena Spurling, RN 10/13/2015,9:52 AM

## 2015-10-14 ENCOUNTER — Encounter (HOSPITAL_BASED_OUTPATIENT_CLINIC_OR_DEPARTMENT_OTHER): Payer: Self-pay | Admitting: *Deleted

## 2015-10-14 NOTE — Addendum Note (Signed)
Encounter addended by: Malena Edman, RN on: 10/14/2015  8:09 AM<BR>     Documentation filed: Charges VN

## 2015-10-15 ENCOUNTER — Other Ambulatory Visit: Payer: Self-pay | Admitting: General Surgery

## 2015-10-18 ENCOUNTER — Ambulatory Visit (HOSPITAL_BASED_OUTPATIENT_CLINIC_OR_DEPARTMENT_OTHER): Payer: No Typology Code available for payment source | Admitting: Anesthesiology

## 2015-10-18 ENCOUNTER — Ambulatory Visit (HOSPITAL_BASED_OUTPATIENT_CLINIC_OR_DEPARTMENT_OTHER)
Admission: RE | Admit: 2015-10-18 | Discharge: 2015-10-18 | Disposition: A | Discharge status: Home / Self Care | Payer: No Typology Code available for payment source | Source: Ambulatory Visit | Attending: General Surgery | Admitting: General Surgery

## 2015-10-18 ENCOUNTER — Encounter (HOSPITAL_BASED_OUTPATIENT_CLINIC_OR_DEPARTMENT_OTHER): Payer: Self-pay | Admitting: Anesthesiology

## 2015-10-18 ENCOUNTER — Encounter (HOSPITAL_BASED_OUTPATIENT_CLINIC_OR_DEPARTMENT_OTHER)
Admission: RE | Disposition: A | Discharge status: Home / Self Care | Payer: Self-pay | Source: Ambulatory Visit | Attending: General Surgery

## 2015-10-18 DIAGNOSIS — C50412 Malignant neoplasm of upper-outer quadrant of left female breast: Secondary | ICD-10-CM | POA: Diagnosis not present

## 2015-10-18 DIAGNOSIS — Z803 Family history of malignant neoplasm of breast: Secondary | ICD-10-CM | POA: Insufficient documentation

## 2015-10-18 DIAGNOSIS — C50912 Malignant neoplasm of unspecified site of left female breast: Secondary | ICD-10-CM | POA: Diagnosis present

## 2015-10-18 DIAGNOSIS — N6032 Fibrosclerosis of left breast: Secondary | ICD-10-CM | POA: Diagnosis not present

## 2015-10-18 DIAGNOSIS — Z87891 Personal history of nicotine dependence: Secondary | ICD-10-CM | POA: Insufficient documentation

## 2015-10-18 DIAGNOSIS — Z9013 Acquired absence of bilateral breasts and nipples: Secondary | ICD-10-CM | POA: Diagnosis not present

## 2015-10-18 HISTORY — PX: RE-EXCISION OF BREAST CANCER,SUPERIOR MARGINS: SHX6047

## 2015-10-18 HISTORY — DX: Myoneural disorder, unspecified: G70.9

## 2015-10-18 SURGERY — RE-EXCISION OF BREAST CANCER,SUPERIOR MARGINS
Anesthesia: General | Site: Breast

## 2015-10-18 MED ORDER — MIDAZOLAM HCL 2 MG/2ML IJ SOLN: 1.0000 mg | INTRAMUSCULAR | Status: DC | PRN

## 2015-10-18 MED ORDER — CEFAZOLIN SODIUM-DEXTROSE 2-4 GM/100ML-% IV SOLN
2.0000 g | INTRAVENOUS | Status: AC
  Administered 2015-10-18: 2 g via INTRAVENOUS

## 2015-10-18 MED ORDER — LIDOCAINE HCL (CARDIAC) 20 MG/ML IV SOLN
INTRAVENOUS | Status: AC
  Filled 2015-10-18: qty 5

## 2015-10-18 MED ORDER — FENTANYL CITRATE (PF) 100 MCG/2ML IJ SOLN
INTRAMUSCULAR | Status: AC
  Filled 2015-10-18: qty 2

## 2015-10-18 MED ORDER — PROPOFOL 10 MG/ML IV BOLUS
INTRAVENOUS | Status: DC | PRN
  Administered 2015-10-18: 150 mg via INTRAVENOUS

## 2015-10-18 MED ORDER — MIDAZOLAM HCL 5 MG/5ML IJ SOLN
INTRAMUSCULAR | Status: DC | PRN
  Administered 2015-10-18: 2 mg via INTRAVENOUS

## 2015-10-18 MED ORDER — HYDROMORPHONE HCL 1 MG/ML IJ SOLN
0.2500 mg | INTRAMUSCULAR | Status: DC | PRN
  Administered 2015-10-18 (×2): 0.25 mg via INTRAVENOUS

## 2015-10-18 MED ORDER — ONDANSETRON HCL 4 MG/2ML IJ SOLN
INTRAMUSCULAR | Status: DC | PRN
  Administered 2015-10-18: 4 mg via INTRAVENOUS

## 2015-10-18 MED ORDER — HYDROMORPHONE HCL 1 MG/ML IJ SOLN
INTRAMUSCULAR | Status: AC
  Filled 2015-10-18: qty 1

## 2015-10-18 MED ORDER — MIDAZOLAM HCL 2 MG/2ML IJ SOLN
INTRAMUSCULAR | Status: AC
  Filled 2015-10-18: qty 2

## 2015-10-18 MED ORDER — SODIUM CHLORIDE 0.9 % IJ SOLN
INTRAMUSCULAR | Status: AC
  Filled 2015-10-18: qty 10

## 2015-10-18 MED ORDER — DEXAMETHASONE SODIUM PHOSPHATE 4 MG/ML IJ SOLN
INTRAMUSCULAR | Status: DC | PRN
  Administered 2015-10-18: 10 mg via INTRAVENOUS

## 2015-10-18 MED ORDER — ONDANSETRON HCL 4 MG/2ML IJ SOLN
INTRAMUSCULAR | Status: AC
  Filled 2015-10-18: qty 2

## 2015-10-18 MED ORDER — KETOROLAC TROMETHAMINE 30 MG/ML IJ SOLN
INTRAMUSCULAR | Status: DC | PRN
  Administered 2015-10-18: 30 mg via INTRAVENOUS

## 2015-10-18 MED ORDER — SCOPOLAMINE 1 MG/3DAYS TD PT72: 1.0000 | MEDICATED_PATCH | Freq: Once | TRANSDERMAL | Status: DC | PRN

## 2015-10-18 MED ORDER — HYDROCODONE-ACETAMINOPHEN 10-325 MG PO TABS: 1.0000 | ORAL_TABLET | Freq: Four times a day (QID) | ORAL | Status: DC | PRN

## 2015-10-18 MED ORDER — HEMOSTATIC AGENTS (NO CHARGE) OPTIME
TOPICAL | Status: DC | PRN
  Administered 2015-10-18: 1 via TOPICAL

## 2015-10-18 MED ORDER — HYDROCODONE-ACETAMINOPHEN 7.5-325 MG PO TABS
1.0000 | ORAL_TABLET | Freq: Once | ORAL | Status: DC | PRN
Start: 2015-10-18 — End: 2015-10-18

## 2015-10-18 MED ORDER — PROPOFOL 10 MG/ML IV BOLUS
INTRAVENOUS | Status: AC
  Filled 2015-10-18: qty 40

## 2015-10-18 MED ORDER — DEXAMETHASONE SODIUM PHOSPHATE 10 MG/ML IJ SOLN
INTRAMUSCULAR | Status: AC
  Filled 2015-10-18: qty 1

## 2015-10-18 MED ORDER — LIDOCAINE HCL (CARDIAC) 20 MG/ML IV SOLN
INTRAVENOUS | Status: DC | PRN
  Administered 2015-10-18: 50 mg via INTRAVENOUS

## 2015-10-18 MED ORDER — GLYCOPYRROLATE 0.2 MG/ML IJ SOLN
0.2000 mg | Freq: Once | INTRAMUSCULAR | Status: AC | PRN
  Administered 2015-10-18: 0.2 mg via INTRAVENOUS

## 2015-10-18 MED ORDER — FENTANYL CITRATE (PF) 100 MCG/2ML IJ SOLN
INTRAMUSCULAR | Status: DC | PRN
  Administered 2015-10-18: 100 ug via INTRAVENOUS

## 2015-10-18 MED ORDER — CEFAZOLIN SODIUM 1 G IJ SOLR
INTRAMUSCULAR | Status: AC
  Filled 2015-10-18: qty 20

## 2015-10-18 MED ORDER — KETOROLAC TROMETHAMINE 30 MG/ML IJ SOLN
INTRAMUSCULAR | Status: AC
  Filled 2015-10-18: qty 1

## 2015-10-18 MED ORDER — KETOROLAC TROMETHAMINE 30 MG/ML IJ SOLN: 30.0000 mg | Freq: Once | INTRAMUSCULAR | Status: DC | PRN

## 2015-10-18 MED ORDER — LACTATED RINGERS IV SOLN
INTRAVENOUS | Status: DC
  Administered 2015-10-18 (×3): via INTRAVENOUS

## 2015-10-18 MED ORDER — FENTANYL CITRATE (PF) 100 MCG/2ML IJ SOLN: 50.0000 ug | INTRAMUSCULAR | Status: DC | PRN

## 2015-10-18 MED ORDER — PROMETHAZINE HCL 25 MG/ML IJ SOLN: 6.2500 mg | INTRAMUSCULAR | Status: DC | PRN

## 2015-10-18 MED ORDER — BUPIVACAINE HCL (PF) 0.25 % IJ SOLN
INTRAMUSCULAR | Status: DC | PRN
  Administered 2015-10-18: 18 mL

## 2015-10-18 SURGICAL SUPPLY — 66 items
APPLIER CLIP 9.375 MED OPEN (MISCELLANEOUS)
APR CLP MED 9.3 20 MLT OPN (MISCELLANEOUS)
BINDER BREAST LRG (GAUZE/BANDAGES/DRESSINGS) IMPLANT
BINDER BREAST MEDIUM (GAUZE/BANDAGES/DRESSINGS) ×2 IMPLANT
BINDER BREAST XLRG (GAUZE/BANDAGES/DRESSINGS) IMPLANT
BINDER BREAST XXLRG (GAUZE/BANDAGES/DRESSINGS) IMPLANT
BIOPATCH RED 1 DISK 7.0 (GAUZE/BANDAGES/DRESSINGS) ×1 IMPLANT
BIOPATCH RED 1IN DISK 7.0MM (GAUZE/BANDAGES/DRESSINGS) ×1
BLADE SURG 15 STRL LF DISP TIS (BLADE) ×1 IMPLANT
BLADE SURG 15 STRL SS (BLADE) ×3
CANISTER SUCT 1200ML W/VALVE (MISCELLANEOUS) IMPLANT
CHLORAPREP W/TINT 26ML (MISCELLANEOUS) ×3 IMPLANT
CLIP APPLIE 9.375 MED OPEN (MISCELLANEOUS) IMPLANT
CLOSURE WOUND 1/2 X4 (GAUZE/BANDAGES/DRESSINGS) ×1
COVER BACK TABLE 60X90IN (DRAPES) ×3 IMPLANT
COVER MAYO STAND STRL (DRAPES) ×3 IMPLANT
DECANTER SPIKE VIAL GLASS SM (MISCELLANEOUS) IMPLANT
DEVICE DUBIN W/COMP PLATE 8390 (MISCELLANEOUS) IMPLANT
DRAIN CHANNEL 15F RND FF W/TCR (WOUND CARE) ×2 IMPLANT
DRAPE LAPAROSCOPIC ABDOMINAL (DRAPES) ×3 IMPLANT
DRSG TEGADERM 4X4.75 (GAUZE/BANDAGES/DRESSINGS) ×5 IMPLANT
ELECT COATED BLADE 2.86 ST (ELECTRODE) ×3 IMPLANT
ELECT REM PT RETURN 9FT ADLT (ELECTROSURGICAL) ×3
ELECTRODE REM PT RTRN 9FT ADLT (ELECTROSURGICAL) ×1 IMPLANT
EVACUATOR SILICONE 100CC (DRAIN) ×2 IMPLANT
GAUZE SPONGE 4X4 12PLY STRL (GAUZE/BANDAGES/DRESSINGS) ×2 IMPLANT
GLOVE BIO SURGEON STRL SZ7 (GLOVE) ×3 IMPLANT
GLOVE BIOGEL PI IND STRL 7.5 (GLOVE) ×1 IMPLANT
GLOVE BIOGEL PI IND STRL 8 (GLOVE) IMPLANT
GLOVE BIOGEL PI INDICATOR 7.5 (GLOVE) ×2
GLOVE BIOGEL PI INDICATOR 8 (GLOVE) ×2
GLOVE SURG SS PI 7.5 STRL IVOR (GLOVE) ×2 IMPLANT
GOWN STRL REUS W/ TWL LRG LVL3 (GOWN DISPOSABLE) ×3 IMPLANT
GOWN STRL REUS W/ TWL XL LVL3 (GOWN DISPOSABLE) IMPLANT
GOWN STRL REUS W/TWL LRG LVL3 (GOWN DISPOSABLE) ×9
GOWN STRL REUS W/TWL XL LVL3 (GOWN DISPOSABLE) ×3
HEMOSTAT ARISTA ABSORB 3G PWDR (MISCELLANEOUS) IMPLANT
ILLUMINATOR WAVEGUIDE N/F (MISCELLANEOUS) IMPLANT
LIGHT WAVEGUIDE WIDE FLAT (MISCELLANEOUS) IMPLANT
LIQUID BAND (GAUZE/BANDAGES/DRESSINGS) ×2 IMPLANT
NDL HYPO 25X1 1.5 SAFETY (NEEDLE) ×1 IMPLANT
NEEDLE HYPO 25X1 1.5 SAFETY (NEEDLE) ×3 IMPLANT
NS IRRIG 1000ML POUR BTL (IV SOLUTION) ×2 IMPLANT
PACK BASIN DAY SURGERY FS (CUSTOM PROCEDURE TRAY) ×3 IMPLANT
PENCIL BUTTON HOLSTER BLD 10FT (ELECTRODE) ×3 IMPLANT
SLEEVE SCD COMPRESS KNEE MED (MISCELLANEOUS) ×3 IMPLANT
SPONGE GAUZE 4X4 12PLY STER LF (GAUZE/BANDAGES/DRESSINGS) ×3 IMPLANT
SPONGE LAP 4X18 X RAY DECT (DISPOSABLE) ×3 IMPLANT
STRIP CLOSURE SKIN 1/2X4 (GAUZE/BANDAGES/DRESSINGS) ×1 IMPLANT
SUT ETHILON 2 0 FS 18 (SUTURE) ×4 IMPLANT
SUT MNCRL AB 3-0 PS2 18 (SUTURE) IMPLANT
SUT MNCRL AB 4-0 PS2 18 (SUTURE) ×2 IMPLANT
SUT MON AB 5-0 PS2 18 (SUTURE) ×3 IMPLANT
SUT SILK 2 0 SH (SUTURE) ×3 IMPLANT
SUT VIC AB 2-0 SH 27 (SUTURE) ×15
SUT VIC AB 2-0 SH 27XBRD (SUTURE) ×1 IMPLANT
SUT VIC AB 3-0 SH 27 (SUTURE) ×3
SUT VIC AB 3-0 SH 27X BRD (SUTURE) ×1 IMPLANT
SUT VIC AB 5-0 PS2 18 (SUTURE) IMPLANT
SUT VICRYL AB 3 0 TIES (SUTURE) IMPLANT
SYR CONTROL 10ML LL (SYRINGE) ×3 IMPLANT
TOWEL OR 17X24 6PK STRL BLUE (TOWEL DISPOSABLE) ×3 IMPLANT
TOWEL OR NON WOVEN STRL DISP B (DISPOSABLE) ×3 IMPLANT
TUBE CONNECTING 20'X1/4 (TUBING)
TUBE CONNECTING 20X1/4 (TUBING) IMPLANT
YANKAUER SUCT BULB TIP NO VENT (SUCTIONS) IMPLANT

## 2015-10-18 NOTE — Transfer of Care (Signed)
Immediate Anesthesia Transfer of Care Note  Patient: Gabriela [name]  Procedure(s) Performed: Procedure(s): EXCISION OF SKIN FOR CLOSE MARGIN--ANTERIOR MARGIN (N/A)  Patient Location: PACU  Anesthesia Type:General  Level of Consciousness: sedated  Airway & Oxygen Therapy: Patient Spontanous Breathing and Patient connected to face mask oxygen  Post-op Assessment: Report given to RN and Post -op Vital signs reviewed and stable  Post vital signs: Reviewed and stable  Last Vitals:  Filed Vitals:   10/18/15 1133  BP: 99/57  Pulse: 58  Temp: 36.7 C  Resp: 16    Complications: No apparent anesthesia complications

## 2015-10-18 NOTE — Interval H&P Note (Signed)
History and Physical Interval Note:  10/18/2015 12:04 PM  Gabriela [name]  has presented today for surgery, with the diagnosis of LEFT BREAST CANCER  The various methods of treatment have been discussed with the patient and family. After consideration of risks, benefits and other options for treatment, the patient has consented to  Procedure(s): EXCISION OF SKIN FOR CLOSE MARGIN (N/A) as a surgical intervention .  The patient's history has been reviewed, patient examined, no change in status, stable for surgery.  I have reviewed the patient's chart and labs.  Questions were answered to the patient's satisfaction.   Has a close/positive margin at anterior flap of mastectomy. This was grossly clear. I know exactly where this area is on her as well.  Discussed options. Will re-excise today and close with new drain.    Laray Corbit

## 2015-10-18 NOTE — Anesthesia Preprocedure Evaluation (Signed)
Anesthesia Evaluation  Patient identified by MRN, date of birth, ID band Patient awake    Reviewed: Allergy & Precautions, H&P , Patient's Chart, lab work & pertinent test results, reviewed documented beta blocker date and time   Airway Mallampati: II  TM Distance: >3 FB Neck ROM: full    Dental no notable dental hx.    Pulmonary former smoker,    Pulmonary exam normal breath sounds clear to auscultation       Cardiovascular  Rhythm:regular Rate:Normal     Neuro/Psych    GI/Hepatic   Endo/Other    Renal/GU      Musculoskeletal   Abdominal   Peds  Hematology   Anesthesia Other Findings   Reproductive/Obstetrics                             Lab Results  Component Value Date   WBC 7.8 09/20/2015   HGB 11.5* 09/20/2015   HCT 34.6* 09/20/2015   MCV 89.2 09/20/2015   PLT 242 09/20/2015   Lab Results  Component Value Date   CREATININE 0.88 09/20/2015   BUN 9 09/20/2015   NA 138 09/20/2015   K 3.7 09/20/2015   CL 105 09/20/2015   CO2 23 09/20/2015    Anesthesia Physical  Anesthesia Plan  ASA: II  Anesthesia Plan: General   Post-op Pain Management:    Induction: Intravenous  Airway Management Planned: LMA  Additional Equipment:   Intra-op Plan:   Post-operative Plan: Extubation in OR  Informed Consent: I have reviewed the patients History and Physical, chart, labs and discussed the procedure including the risks, benefits and alternatives for the proposed anesthesia with the patient or authorized representative who has indicated his/her understanding and acceptance.   Dental advisory given  Plan Discussed with: CRNA and Surgeon  Anesthesia Plan Comments: (  )        Anesthesia Quick Evaluation

## 2015-10-18 NOTE — Anesthesia Procedure Notes (Signed)
Procedure Name: LMA Insertion Date/Time: 10/18/2015 12:16 PM Performed by: Toula Moos L Pre-anesthesia Checklist: Patient identified, Emergency Drugs available, Suction available, Patient being monitored and Timeout performed Patient Re-evaluated:Patient Re-evaluated prior to inductionOxygen Delivery Method: Circle System Utilized Preoxygenation: Pre-oxygenation with 100% oxygen Intubation Type: IV induction Ventilation: Mask ventilation without difficulty LMA: LMA inserted LMA Size: 4.0 Number of attempts: 1 Airway Equipment and Method: Bite block Placement Confirmation: positive ETCO2 Tube secured with: Tape Dental Injury: Teeth and Oropharynx as per pre-operative assessment

## 2015-10-18 NOTE — Anesthesia Postprocedure Evaluation (Signed)
Anesthesia Post Note  Patient: Gabriela [name]  Procedure(s) Performed: Procedure(s) (LRB): EXCISION OF SKIN FOR CLOSE MARGIN--ANTERIOR MARGIN (N/A)  Patient location during evaluation: PACU Anesthesia Type: General Level of consciousness: awake and alert Pain management: pain level controlled Vital Signs Assessment: post-procedure vital signs reviewed and stable Respiratory status: spontaneous breathing, nonlabored ventilation and respiratory function stable Cardiovascular status: blood pressure returned to baseline and stable Postop Assessment: no signs of nausea or vomiting Anesthetic complications: no    Last Vitals:  Filed Vitals:   10/18/15 1430 10/18/15 1436  BP: 108/79   Pulse: 61 44  Temp:    Resp: 15 17    Last Pain:  Filed Vitals:   10/18/15 1436  PainSc: 5                  [name]

## 2015-10-18 NOTE — Discharge Instructions (Signed)
[facility] [place] °[phone] ° °MASTECTOMY: POST OP INSTRUCTIONS ° °Always review your discharge instruction sheet given to you by the facility where your surgery was performed. °IF YOU HAVE DISABILITY OR FAMILY LEAVE FORMS, YOU MUST BRING THEM TO THE OFFICE FOR PROCESSING.   °DO NOT GIVE THEM TO YOUR DOCTOR. °A prescription for pain medication may be given to you upon discharge.  Take your pain medication as prescribed, if needed.  If narcotic pain medicine is not needed, then you may take acetaminophen (Tylenol), naprosyn (Alleve) or ibuprofen (Advil) as needed. °1. Take your usually prescribed medications unless otherwise directed. °2. If you need a refill on your pain medication, please contact your pharmacy.  They will contact our office to request authorization.  Prescriptions will not be filled after 5pm or on week-ends. °3. You should follow a light diet the first few days after arrival home, such as soup and crackers, etc.  Resume your normal diet the day after surgery. °4. Most patients will experience some swelling and bruising on the chest and underarm.  Ice packs will help.  Swelling and bruising can take several days to resolve. Wear the binder day and night until you return to the office.  °5. It is common to experience some constipation if taking pain medication after surgery.  Increasing fluid intake and taking a stool softener (such as Colace) will usually help or prevent this problem from occurring.  A mild laxative (Milk of Magnesia or Miralax) should be taken according to package instructions if there are no bowel movements after 48 hours. °6. Unless discharge instructions indicate otherwise, leave your bandage dry and in place until your next appointment in 3-5 days.  You may take a limited sponge bath.  No tube baths or showers until the drains are removed.  You may have steri-strips (small skin tapes) in place directly over the incision.  These strips should be left on the  skin for 7-10 days. If you have glue it will come off in next couple week.  Any sutures will be removed at an office visit °7. DRAINS:  If you have drains in place, it is important to keep a list of the amount of drainage produced each day in your drains.  Before leaving the hospital, you should be instructed on drain care.  Call our office if you have any questions about your drains. I will remove your drains when they put out less than 30 cc or ml for 2 consecutive days. °8. ACTIVITIES:  You may resume regular (light) daily activities beginning the next day--such as daily self-care, walking, climbing stairs--gradually increasing activities as tolerated.  You may have sexual intercourse when it is comfortable.  Refrain from any heavy lifting or straining until approved by your doctor. °a. You may drive when you are no longer taking prescription pain medication, you can comfortably wear a seatbelt, and you can safely maneuver your car and apply brakes. °b. RETURN TO WORK:  __________________________________________________________ °9. You should see your doctor in the office for a follow-up appointment approximately 3-5 days after your surgery.  Your doctor’s nurse will typically make your follow-up appointment when she calls you with your pathology report.  Expect your pathology report 3-4business days after surgery. °10. OTHER INSTRUCTIONS: ______________________________________________________________________________________________ ____________________________________________________________________________________________ °WHEN TO CALL YOUR DR [place]KEFIELD: °1. Fever over 101.0 °2. Nausea and/or vomiting °3. Extreme swelling or bruising °4. Continued bleeding from incision. °5. Increased pain, redness, or drainage from the incision. °The clinic staff is available   to answer your questions during regular business hours.  Please don’t hesitate to call and ask to speak to one of the nurses for clinical concerns.  If  you have a medical emergency, go to the nearest emergency room or call 911.  A surgeon from [facility] is always on call at the hospital. °[address], [address], [place], [place]  [address] ? [address] [address], [place], [place]   [address] °[phone] ? [phone] ? FAX [phone] °Web site: [email or web address] ° °Post Anesthesia Home Care Instructions ° °Activity: °Get plenty of rest for the remainder of the day. A responsible adult should stay with you for 24 hours following the procedure.  °For the next 24 hours, DO NOT: °-Drive a car °-Operate machinery °-Drink alcoholic beverages °-Take any medication unless instructed by your physician °-Make any legal decisions or sign important papers. ° °Meals: °Start with liquid foods such as gelatin or soup. Progress to regular foods as tolerated. Avoid greasy, spicy, heavy foods. If nausea and/or vomiting occur, drink only clear liquids until the nausea and/or vomiting subsides. Call your physician if vomiting continues. ° °Special Instructions/Symptoms: °Your throat may feel dry or sore from the anesthesia or the breathing tube placed in your throat during surgery. If this causes discomfort, gargle with warm salt water. The discomfort should disappear within 24 hours. ° °If you had a scopolamine patch placed behind your ear for the management of post- operative nausea and/or vomiting: ° °1. The medication in the patch is effective for 72 hours, after which it should be removed.  Wrap patch in a tissue and discard in the trash. Wash hands thoroughly with soap and water. °2. You may remove the patch earlier than 72 hours if you experience unpleasant side effects which may include dry mouth, dizziness or visual disturbances. °3. Avoid touching the patch. Wash your hands with soap and water after contact with the patch. °  ° °

## 2015-10-18 NOTE — H&P (View-Only) (Signed)
45 yof who palpated a left breast mass recently. she has family history of mom at age 46 and mgm at age 43 with breast cancer. she refused to do mm. US shows a irregular hypoechoic mass that measures 1.6x1x1.1 cm. US of the axilla is negative. 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 she is here with her husband today   Other Problems Conni Slipper, RN; 09/15/2015 8:00 AM) Lump In Breast  Past Surgical History Conni Slipper, RN; 09/15/2015 8:00 AM) No pertinent past surgical history  Diagnostic Studies History Conni Slipper, RN; 09/15/2015 7:59 AM) Colonoscopy never Pap Smear 1-5 years ago  Medication History Conni Slipper, RN; 09/15/2015 8:00 AM) No Current Medications Medications Reconciled  Social History Conni Slipper, RN; 09/15/2015 8:00 AM) Alcohol use Recently quit alcohol use. Caffeine use Tea. No drug use Tobacco use Former smoker.  Family History Conni Slipper, RN; 09/15/2015 8:00 AM) Breast Cancer Family Members In General, Mother. Respiratory Condition Family Members In General.  Pregnancy / Birth History Conni Slipper, RN; 09/15/2015 8:00 AM) Age at menarche 45 years. Gravida 2 Irregular periods Maternal age 29-30 Para 2    Review of Systems Conni Slipper RN; 09/15/2015 8:00 AM) General Not Present- Appetite Loss, Chills, Fatigue, Fever, Night Sweats, Weight Gain and Weight Loss. Skin Not Present- Change in Wart/Mole, Dryness, Hives, Jaundice, New Lesions, Non-Healing Wounds, Rash and Ulcer. HEENT Not Present- Earache, Hearing Loss, Hoarseness, Nose Bleed, Oral Ulcers, Ringing in the Ears, Seasonal Allergies, Sinus Pain, Sore Throat, Visual Disturbances, Wears glasses/contact lenses and Yellow Eyes. Respiratory Not Present- Bloody sputum, Chronic Cough, Difficulty Breathing, Snoring and Wheezing. Breast Present- Breast Mass and Breast Pain. Not Present- Nipple Discharge and Skin  Changes. Cardiovascular Not Present- Chest Pain, Difficulty Breathing Lying Down, Leg Cramps, Palpitations, Rapid Heart Rate, Shortness of Breath and Swelling of Extremities. Gastrointestinal Not Present- Abdominal Pain, Bloating, Bloody Stool, Change in Bowel Habits, Chronic diarrhea, Constipation, Difficulty Swallowing, Excessive gas, Gets full quickly at meals, Hemorrhoids, Indigestion, Nausea, Rectal Pain and Vomiting. Female Genitourinary Not Present- Frequency, Nocturia, Painful Urination, Pelvic Pain and Urgency. Musculoskeletal Not Present- Back Pain, Joint Pain, Joint Stiffness, Muscle Pain, Muscle Weakness and Swelling of Extremities. Neurological Not Present- Decreased Memory, Fainting, Headaches, Numbness, Seizures, Tingling, Tremor, Trouble walking and Weakness. Psychiatric Not Present- Anxiety, Bipolar, Change in Sleep Pattern, Depression, Fearful and Frequent crying. Endocrine Present- Cold Intolerance. Not Present- Excessive Hunger, Hair Changes, Heat Intolerance, Hot flashes and New Diabetes. Hematology Not Present- Easy Bruising, Excessive bleeding, Gland problems, HIV and Persistent Infections.   Physical Exam Rolm Bookbinder MD; 09/15/2015 2:06 PM) General Mental Status-Alert. Orientation-Oriented X3.  Chest and Lung Exam Chest and lung exam reveals -on auscultation, normal breath sounds, no adventitious sounds and normal vocal resonance.  Breast Nipples-No Discharge.   Cardiovascular Cardiovascular examination reveals -normal heart sounds, regular rate and rhythm with no murmurs.  Lymphatic Head & Neck  General Head & Neck Lymphatics: Bilateral - Description - Normal. Axillary  General Axillary Region: Bilateral - Description - Normal. 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 she is adamant after the conversation that without a genetic mutation I dont recommend bilateral mastectomy. I think on the left is reasonable  given her desire to avoid radiotherapy but this is still a chance. we discussed at length lumpectomy/radiation which I think she is candidate for as equivalent locally and systemically in terms of survivial to mastectomy. she understands that prophylactic in absence of genetic mutation is not necessary. due to her mothers cancer and fh she very strongly desires this although she is aware it is not 100% preventive and there is up to 5% risk of breast cancer after mastectomy. she does not want any reconstruction either. she has been hesitant to get mm but we have agreed that she will at least get a tomo view of the right breast prior to surgery. will then proceed with bilateral mastectomies. e discussed a sentinel lymph node biopsy as she does not appear to having lymph node involvement right now. We discussed the performance of that with injection of radioactive tracer and possibly blue dye. We discussed that she would have an incision underneath her axillary hairline or would be done through the same incision. We discussed that there is a chance of having a positive node with a sentinel lymph node biopsy and we will await the permanent pathology to make any other first further decisions in terms of her treatment. One of these options might be to return to the operating room to perform an axillary lymph node dissection. We discussed up to a 5% risk lifetime of chronic shoulder pain as well as lymphedema associated with a sentinel lymph node biopsy.

## 2015-10-18 NOTE — Op Note (Signed)
Preoperative diagnosis: Left breast cancer clinical stage I s/p bilateral mastectomies with positive anterior margin after mastectomy Postoperative diagnosis: Same as above Procedure: excision of 10x5 cm skin with anterior margin from mastectomy Surgeon: Dr. Serita Grammes Anesthesia: Gen Specimens: chest wall skin marked short superior, long lateral and double deep Complications: None Drains: 15 [place] Blake drain Estimated blood loss: minimal Sponge count was correct at completion Disposition to recovery in stable condition  Indications: This is a 81 yof who has newly diagnosed left breast cancer. She underwent bilateral mastectomies with left axillary sentinel node for stage I left breast cancer.  Her anterior margin is positive. I discussed with her as I know where this area is we could reexcise this.  She is agreeable.   Procedure: After informed consent was obtained she was taken to the operating room.  She was administered antibiotics. Sequential compression devices were on her legs. She was then placed under general anesthesia without complication. Her left chest was then prepped and draped in the standard sterile surgical fashion. A surgical timeout was then performed.    I then made an elliptical incision that included the area of the cancer.  This measured as above. i went over 4 cm superior which was well within where the prior tumor was.  I then excised this down the muscle. This was marked and passed off the table.  Hemostasis was obtained. I then created flaps again superiorly and inferiorly.  I placed arista in the wound. I removed the old drain and placed a 15 Fr Blake drain. This was secured with a 2-0 nylon suture.  I then closed the dermis with 2-0 vicryl suture and the skin with 4-0 monocryl. I placed one 2-0 nylon mattress suture in the middle to take some tension off.  I then placed glue and steristrips. A breast binder was placed. She tolerated this well was extubated  and transferred to recovery stable.

## 2015-10-19 ENCOUNTER — Other Ambulatory Visit: Payer: No Typology Code available for payment source

## 2015-10-19 ENCOUNTER — Encounter (HOSPITAL_BASED_OUTPATIENT_CLINIC_OR_DEPARTMENT_OTHER): Payer: Self-pay | Admitting: General Surgery

## 2015-10-19 ENCOUNTER — Encounter: Payer: No Typology Code available for payment source | Admitting: Genetic Counselor

## 2015-10-22 ENCOUNTER — Telehealth: Payer: Self-pay | Admitting: *Deleted

## 2015-10-22 DIAGNOSIS — C50212 Malignant neoplasm of upper-inner quadrant of left female breast: Secondary | ICD-10-CM

## 2015-10-22 NOTE — Telephone Encounter (Signed)
Called to assess needs after sx. Relate doing better each day. Discussed survivorship program and referral. Pt enthusiastic about [facility]. Encourage pt to call with needs. Received verbal understanding

## 2015-11-08 ENCOUNTER — Other Ambulatory Visit: Payer: No Typology Code available for payment source

## 2015-11-08 ENCOUNTER — Ambulatory Visit (HOSPITAL_BASED_OUTPATIENT_CLINIC_OR_DEPARTMENT_OTHER): Payer: No Typology Code available for payment source | Admitting: Genetic Counselor

## 2015-11-08 ENCOUNTER — Encounter: Payer: Self-pay | Admitting: Genetic Counselor

## 2015-11-08 ENCOUNTER — Encounter: Payer: Self-pay | Admitting: General Practice

## 2015-11-08 DIAGNOSIS — C50212 Malignant neoplasm of upper-inner quadrant of left female breast: Secondary | ICD-10-CM | POA: Diagnosis not present

## 2015-11-08 DIAGNOSIS — Z315 Encounter for genetic counseling: Secondary | ICD-10-CM

## 2015-11-08 DIAGNOSIS — Z803 Family history of malignant neoplasm of breast: Secondary | ICD-10-CM | POA: Insufficient documentation

## 2015-11-08 NOTE — Progress Notes (Signed)
REFERRING PROVIDER: Marius Ditch, MD [phone] [address] [place], [place] [address]   Truitt Merle, MD  PRIMARY PROVIDER:  Carlena Sax, MD  PRIMARY REASON FOR VISIT:  1. Breast cancer of upper-inner quadrant of left female breast ([place])   2. Family history of breast cancer      HISTORY OF PRESENT ILLNESS:   [name], a 45 y.o. female, was seen for a [place] cancer genetics consultation at the request of Dr. Burr Medico due to a personal and family history of cancer.  [name] presents to clinic today to discuss the possibility of a hereditary predisposition to cancer, genetic testing, and to further clarify her future cancer risks, as well as potential cancer risks for family members.   In March 2017, at the age of 35, [name] was diagnosed with invasive ductal carcinoma of the right breast. The tumor was ER+/PR+/Her2-. This was treated with Bilateral Mastectomy.  She will be put on tamoxifen.    CANCER HISTORY:  Oncology History   Breast cancer of upper-inner quadrant of left female breast [facility])   Staging form: Breast, AJCC 7th Edition     Clinical stage from 09/06/2015: Stage IA (T1c, N0, M0) - Signed by Truitt Merle, MD on 09/14/2015     Pathologic stage from 09/23/2015: Stage IA (T1c, N0, cM0) - Signed by Truitt Merle, MD on 10/11/2015        Breast cancer of upper-inner quadrant of left female breast ([place])   08/31/2015 Imaging Pt declined mammogram, [place] of left breast showed a 1.6 x 1 x 1.1 cm hypoechoic mass within the left breast at the 10:00 position. Axilla nodes appear to be normal.   09/06/2015 Initial Diagnosis Breast cancer of upper-inner quadrant of left female breast ([place])   09/06/2015 Initial Biopsy Ultrasound-guided left breast mass biopsy showed G1-2 invasive ductal carcinoma, DCIS with associated calcifications.   09/06/2015 Receptors her2 ER 100% positive, PR 100% positive, HER-2 negative, Ki-67 10%   09/23/2015 Surgery  bilateral mastectomy and left sentinel lymph node  biopsy.   09/23/2015 Pathology Results  Left breast mastectomy showed invasive and in situ carcinoma, 1.8 cm, invasive carcinoma focally involves the anterior margin, 6 lymph nodes were negative.   09/23/2015 Oncotype testing Oncotype DX RS: 7, low risk, which predicts 10 year distant recurrence risk of 6% with tamoxifen alone.     HORMONAL RISK FACTORS:  Menarche was at age 60.  First live birth at age 78.  OCP use for approximately 0 years.  Ovaries intact: yes.  Hysterectomy: no.  Menopausal status: premenopausal.  HRT use: 0 years. Colonoscopy: no; not examined. Mammogram within the last year: yes. Number of breast biopsies: 1. Up to date with pelvic exams:  yes. Any excessive radiation exposure in the past:  no  Past Medical History  Diagnosis Date  . Depression   . Breast cancer of upper-inner quadrant of left female breast ([place]) 09/09/2015  . Anemia     "mild" (09/23/2015)  . Chronic left shoulder pain   . Neuromuscular disorder ([place])     nerve pain under left arm  . Family history of breast cancer     Past Surgical History  Procedure Laterality Date  . Wisdom tooth extraction    . Mastectomy complete / simple w/ sentinel node biopsy Left 09/23/2015    AXILLARY SENTINEL LYMPH NODE BIOPSY  . Mastectomy complete / simple Right 09/23/2015    PROPHYLACTIC   . Tonsillectomy    .  Breast surgery Left 2017  . Mastectomy w/ sentinel node biopsy Bilateral 09/23/2015    Procedure: LEFT TOTAL MASTECTOMY WITH LEFT AXILLARY SENTINEL LYMPH NODE BIOPSY, RIGHT PROPHYLACTIC TOTAL MASTECTOMY;  Surgeon: Rolm Bookbinder, MD;  Location: [facility];  Service: General;  Laterality: Bilateral;  . Re-excision of breast cancer,superior margins N/A 10/18/2015    Procedure: EXCISION OF SKIN FOR CLOSE MARGIN--ANTERIOR MARGIN;  Surgeon: Rolm Bookbinder, MD;  Location: [facility];  Service: General;  Laterality: N/A;    Social History   Social History  . Marital Status: Married     Spouse Name: N/A  . Number of Children: 2  . Years of Education: N/A   Social History Main Topics  . Smoking status: Former Smoker -- 1.00 packs/day for 10 years    Types: Cigarettes    Quit date: 06/27/1995  . Smokeless tobacco: Never Used  . Alcohol Use: Yes     Comment: 09/23/2015 was 3 glasses of wine/wk; nothing in the last month"  . Drug Use: No  . Sexual Activity: Not Currently   Other Topics Concern  . None   Social History Narrative     FAMILY HISTORY:  We obtained a detailed, 4-generation family history.  Significant diagnoses are listed below: Family History  Problem Relation Age of Onset  . Cancer Mother   . Breast cancer Mother 41  . Breast cancer Maternal Grandmother 62  . Congestive Heart Failure Maternal Grandmother   . Lung cancer Maternal Grandfather     The patient has two sons who are cancer free.  She also has one healthy cancer free brother.  The patient's mother was diagnosed with breast cancer at 60 and died at 10.  She has one brother who is healthy and cancer free.  The patient's maternal grandmother had breast cancer at 48 and died of congestive heart failure at age 72.  Her grandfather died of lung cancer in his 66s.  The patient's father is adopted and no information is known about his side of the family.  Patient's maternal ancestors are of [place] descent, and paternal ancestors are of Caucasian descent. There is no reported Ashkenazi Jewish ancestry. There is no known consanguinity.  GENETIC COUNSELING ASSESSMENT: Gabriela [name] is a 45 y.o. female with a personal and family history of breast cancer which is somewhat suggestive of a hereditary cancer syndrome and predisposition to cancer. We, therefore, discussed and recommended the following at today's visit.   DISCUSSION: We discussed that about 5-10% of breast cancer is hereditary, with most cases due to BRCA mutations.  Other genes associated with breast cancer include PALB2, ATM and  CHEK2.  We reviewed the characteristics, features and inheritance patterns of hereditary cancer syndromes. We also discussed genetic testing, including the appropriate family members to test, the process of testing, insurance coverage and turn-around-time for results. We discussed the implications of a negative, positive and/or variant of uncertain significant result. We recommended [name] pursue genetic testing for the Breast/Ovarian cancer gene panel. The Breast/Ovarian gene panel offered by [facility] includes sequencing and rearrangement analysis for the following 20 genes:  ATM, BARD1, BRCA1, BRCA2, BRIP1, CDH1, CHEK2, EPCAM, FANCC, MLH1, MSH2, MSH6, NBN, PALB2, PMS2, PTEN, RAD51C, RAD51D, TP53, and XRCC2.     Based on Gabriela [name]'s personal and family history of cancer, she meets medical criteria for genetic testing. Despite that she meets criteria, she may still have an out of pocket cost. We discussed that if her out of pocket cost  for testing is over $100, the laboratory will call and confirm whether she wants to proceed with testing.  If the out of pocket cost of testing is less than $100 she will be billed by the genetic testing laboratory.   PLAN: After considering the risks, benefits, and limitations, [name]  provided informed consent to pursue genetic testing and the blood sample was sent to [facility] for analysis of the Breast/Ovarian cancer panel. Results should be available within approximately 2-3 weeks' time, at which point they will be disclosed by telephone to [name], as will any additional recommendations warranted by these results. [name] will receive a summary of her genetic counseling visit and a copy of her results once available. This information will also be available in Epic. We encouraged [name] to remain in contact with cancer genetics annually so that we can continuously update the family history and inform her of any changes in cancer genetics and testing that  may be of benefit for her family. Gabriela [name] questions were answered to her satisfaction today. Our contact information was provided should additional questions or concerns arise.  Lastly, we encouraged [name] to remain in contact with cancer genetics annually so that we can continuously update the family history and inform her of any changes in cancer genetics and testing that may be of benefit for this family.   Ms.  Shann [name] questions were answered to her satisfaction today. Our contact information was provided should additional questions or concerns arise. Thank you for the referral and allowing [place] to share in the care of your patient.   Karen P. Florene Glen, [place], [facility] Certified Genetic Counselor [email or web address] phone: [phone]  The patient was seen for a total of 45 minutes in face-to-face genetic counseling.  This patient was discussed with Drs. Magrinat, Lindi Adie and/or Burr Medico who agrees with the above.    _______________________________________________________________________ For Office Staff:  Number of people involved in session: 1 Was an Intern/ student involved with case: no

## 2015-11-09 NOTE — Progress Notes (Signed)
Spiritual Care Note  Had ca 90-minute appointment with pt, who goes by Gabriela [name], for emotional support and opportunity to process grief and other distress associated with dx and double mastectomy. Per pt, she has found a very supportive online community of women who are "living flat," or choosing not to have reconstruction after mastectomy. Connecting with women of similar mindset and experience has helped her cope with the emotional adjustments related to body image, personal identity (what it means to be a woman/feminine in relation to anatomy, surgery, and physique). Gabriela [name] used opportunity well to process her feelings, experiences, and tips that she would give to future patients; provided updated [place] and [place] contact/programming information to help her find a route to direct her wisdom, advice, and questions.  We also talked about Finding Your New Normal as a program resource and a concept to normalize the emotional struggles of adjustment and healing. She plans to reach out as desired, but please also page if needs arise/circumstances change. Thank you.   Gabriela [name], Gabriela [name], [name] Pager [phone] Voicemail [phone]

## 2015-11-10 ENCOUNTER — Other Ambulatory Visit: Payer: Self-pay | Admitting: General Surgery

## 2015-11-10 ENCOUNTER — Ambulatory Visit: Payer: No Typology Code available for payment source | Attending: General Surgery | Admitting: Physical Therapy

## 2015-11-10 DIAGNOSIS — M25612 Stiffness of left shoulder, not elsewhere classified: Secondary | ICD-10-CM

## 2015-11-10 DIAGNOSIS — R2232 Localized swelling, mass and lump, left upper limb: Secondary | ICD-10-CM

## 2015-11-10 DIAGNOSIS — M25512 Pain in left shoulder: Secondary | ICD-10-CM | POA: Diagnosis present

## 2015-11-10 DIAGNOSIS — Z9012 Acquired absence of left breast and nipple: Secondary | ICD-10-CM

## 2015-11-10 NOTE — Therapy (Addendum)
[place], [place], [address] Phone: [phone]   Fax:  [phone]  Physical Therapy Evaluation  Patient Details  Name: Gabriela [name] MRN: [id number] Date of Birth: 1970-11-24 Referring Provider: Dr. Rolm Bookbinder  Encounter Date: 11/10/2015      PT End of Session - 11/10/15 1535    Visit Number 1   Number of Visits 2   Date for PT Re-Evaluation 12/10/15   PT Start Time 6063   PT Stop Time 1525   PT Time Calculation (min) 47 min   Activity Tolerance Patient tolerated treatment well   Behavior During Therapy [facility] for tasks assessed/performed      Past Medical History  Diagnosis Date  . Depression   . Breast cancer of upper-inner quadrant of left female breast ([place]) 09/09/2015  . Anemia     "mild" (09/23/2015)  . Chronic left shoulder pain   . Neuromuscular disorder ([place])     nerve pain under left arm  . Family history of breast cancer     Past Surgical History  Procedure Laterality Date  . Wisdom tooth extraction    . Mastectomy complete / simple w/ sentinel node biopsy Left 09/23/2015    AXILLARY SENTINEL LYMPH NODE BIOPSY  . Mastectomy complete / simple Right 09/23/2015    PROPHYLACTIC   . Tonsillectomy    . Breast surgery Left 2017  . Mastectomy w/ sentinel node biopsy Bilateral 09/23/2015    Procedure: LEFT TOTAL MASTECTOMY WITH LEFT AXILLARY SENTINEL LYMPH NODE BIOPSY, RIGHT PROPHYLACTIC TOTAL MASTECTOMY;  Surgeon: Rolm Bookbinder, MD;  Location: [facility];  Service: General;  Laterality: Bilateral;  . Re-excision of breast cancer,superior margins N/A 10/18/2015    Procedure: EXCISION OF SKIN FOR CLOSE MARGIN--ANTERIOR MARGIN;  Surgeon: Rolm Bookbinder, MD;  Location: [facility];  Service: General;  Laterality: N/A;    There were no vitals filed for this visit.       Subjective Assessment - 11/10/15 1449    Subjective Gabriela little residual stiffness from my  [name], which was worsened after a second surgery.  Feels it is improving.  Gets a pinch or tug sometimes.  Got a small opening in the left chest incision.  Did yoga prior to sugeries 2-3 x/week, and plans to get back to that.   Pertinent History Patient was diagnosed on 08/24/15 with left grade 2-3 invasive ductal carcinoma breast cancer.  It is ER/PR positive and HER2 negative with a Ki67 of 10%. The mass measures 1.6 cm located in the upper inner quadrant.  Had bilateral mastectomies in March and second surgery to remove more skin on the left side because the anterior margin was not clear.  Had 5-6 sentinel node biopsies, which were all negative.  No chemo nor radiation planned; will be on tamoxifen.  No other health issues, but h/o left shoulder chronic pain, which hasn' t returned after the surgery.  It was chronic and sometimes debilitating.     Patient Stated Goals it was recommended that I have an appointment; I don't know that I need it   Currently in Pain? No/denies            [facility] PT Assessment - 11/10/15 0001    Assessment   Medical Diagnosis Left breast cancer   Referring Provider Dr. Rolm Bookbinder   Onset Date/Surgical Date 09/20/15  approx.   Precautions   Precautions Other (comment)   Precaution Comments cancer precautions   Restrictions  Weight Bearing Restrictions No   Balance Screen   Has the patient fallen in the past 6 months No   Has the patient had a decrease in activity level because of a fear of falling?  No   Is the patient reluctant to leave their home because of a fear of falling?  No   Home Environment   Living Environment Private residence   Living Arrangements Spouse/significant other;Children  Husband and 2 children ages 53 and 46   Prior Function   Level of Independence Independent   Vocation Part time employment;Student   Vocation Requirements Part time retail work at [employer] and is in school full time for [employer]nd [employer]    Leisure She does not exercise except yoga.  Is trying to get out and walk at least 3x/week   Cognition   Overall Cognitive Status Within Functional Limits for tasks assessed   Observation/Other Assessments   Observations Patient declines having incisions viewed   Quick DASH  20.45   ROM / Strength   AROM / PROM / Strength AROM   AROM   Right Shoulder Flexion 154 Degrees   Right Shoulder ABduction 188 Degrees   Right Shoulder Internal Rotation 74 Degrees  in supine   Right Shoulder External Rotation 90 Degrees   Left Shoulder Flexion 128 Degrees   Left Shoulder ABduction 99 Degrees   Left Shoulder Internal Rotation 78 Degrees  in supine   Left Shoulder External Rotation 75 Degrees  pain, pulling in incision   Palpation   Palpation comment at left inferior axilla toward upper outer breast, she has a line of tightness that may be cording, but feels bumpy  advised pt. this could be cording, but to ask surgeon   Ambulation/Gait   Ambulation/Gait Yes           LYMPHEDEMA/ONCOLOGY QUESTIONNAIRE - 11/10/15 1509    Right Upper Extremity Lymphedema   10 cm Proximal to Olecranon Process 22.7 cm   Olecranon Process 21.2 cm   10 cm Proximal to Ulnar Styloid Process 18.4 cm   Just Proximal to Ulnar Styloid Process 14 cm   Across Hand at [employer] 18.3 cm   At [place] of 2nd Digit 5.9 cm   Left Upper Extremity Lymphedema   10 cm Proximal to Olecranon Process 22.3 cm   Olecranon Process 21.4 cm   10 cm Proximal to Ulnar Styloid Process 17.6 cm   Just Proximal to Ulnar Styloid Process 13.7 cm   Across Hand at [employer] 17.8 cm   At [place] of 2nd Digit 5.6 cm   Other has lost 20 lbs. since diagnosis           Katina Dung - 11/10/15 0001    Open a tight or new jar Mild difficulty   Do heavy household chores (wash walls, wash floors) Mild difficulty   Carry a shopping bag or briefcase No difficulty   Wash your back Mild difficulty   Use a knife to cut food No difficulty    Recreational activities in which you take some force or impact through your arm, shoulder, or hand (golf, hammering, tennis) Mild difficulty   During the past week, to what extent has your arm, shoulder or hand problem interfered with your normal social activities with family, friends, neighbors, or groups? Slightly   During the past week, to what extent has your arm, shoulder or hand problem limited your work or other regular daily activities Slightly   Arm,  shoulder, or hand pain. Mild   Tingling (pins and needles) in your arm, shoulder, or hand Mild   Difficulty Sleeping Mild difficulty   DASH Score 20.45 %                     PT Education - 11/10/15 1534    Education provided Yes   Education Details Do yoga as she plans to do and do [place] post op HEP as well.  Was given info on ABC class and encouraged to attend.   Person(s) Educated Patient   Methods Explanation;Handout  handout about ABC class   Comprehension Verbalized understanding              Breast Clinic Goals - 09/15/15 1436    Patient will be able to verbalize understanding of pertinent lymphedema risk reduction practices relevant to her diagnosis specifically related to skin care.   Time 1   Period Days   Status Achieved   Patient will be able to return demonstrate and/or verbalize understanding of the post-op home exercise program related to regaining shoulder range of motion.   Time 1   Period Days   Status Achieved   Patient will be able to verbalize understanding of the importance of attending the postoperative After Breast Cancer Class for further lymphedema risk reduction education and therapeutic exercise.   Time 1   Period Days   Status Achieved          Long Term Clinic Goals - 11/10/15 1545    CC Long Term Goal  #1   Title Patient will show left shoulder active flexion to at least 140 degrees.   Time 4   Period Weeks   Status New   CC Long Term Goal  #2   Title Active left  shoulder abduction to at least 160 degrees.   Time 4   Period Weeks   Status New   CC Long Term Goal  #3   Title Pt. will be knowledgeable about lymphedema risk reduction practices.   Time 4   Period Weeks   Status New   CC Long Term Goal  #4   Title Pt. will move the left arm freely without significant pain.   Time 4   Period Weeks   Status New   CC Long Term Goal  #5   Title Quick DASH score reduced to 10 or less.   Baseline 20.45 at eval   Time 4   Period Weeks   Status New            Plan - 11/10/15 1537    Clinical Impression Statement Patient is s/p bilateral mastectomies for left breast cancer in March of this year, with a follow-up to remove more skin on left side to get clear margins.  She reports a small wound that she has called the surgeon's office about at the left incision, but does not want it to be viewed today; she says that office told her it was normal.  Her right shoulder AROM is about where it was prior to surgery, but left is about 30% less in abduction and 10% less in other motions compared to that time.  She has an area at upper outer left breast near axilla that feels like it may be a cord, but is lumpy.  She was asked to have her surgeon look at this.  She was told it may be cording at that if so, that would resolve over time (and that  she could feel a popping sensation with that).  Her UE circumference measurements overall are slightly less than measured before surgery, but she reports having lost 15-20 lbs. since diagnosis, probably from having changed her diet.   Rehab Potential Good   Clinical Impairments Affecting Rehab Potential None   PT Frequency [place]   PT Duration 4 weeks   PT Treatment/Interventions ADLs/Self Care Home Management;DME Instruction;Therapeutic exercise;Patient/family education;Manual techniques;Manual lymph drainage;Scar mobilization;Passive range of motion   PT Next Visit Plan Repeat quick DASH.  Patient wants to return to yoga  and see how she does, but have a follow-up in four weeks to check on progress and in part to have accountability to someone for this.  Next visit, recheck ROM and add to HEP and plan if needed, or DC if goals met.  Also go over lymphedema risk reduction practices.   PT Home Exercise Plan Post op shoulder ROM HEP, yoga classes   Recommended Other Services attend ABC class   Consulted and Agree with Plan of Care Patient      Patient will benefit from skilled therapeutic intervention in order to improve the following deficits and impairments:  Decreased range of motion, Decreased knowledge of precautions, Decreased knowledge of use of DME, Pain  Visit Diagnosis: Stiffness of left shoulder, not elsewhere classified - Plan: PT plan of care cert/re-cert  Pain in left shoulder - Plan: PT plan of care cert/re-cert     Problem List Patient Active Problem List   Diagnosis Date Noted  . Family history of breast cancer   . Breast cancer ([place]) 09/23/2015  . Breast cancer of upper-inner quadrant of left female breast ([place]) 09/09/2015    Gabriela [name],Gabriela [name] 11/10/2015, 3:51 PM  [place], [place], [address] Phone: [phone]   Fax:  [phone]  Name: Gabriela [name] MRN: [id number] Date of Birth: Oct 29, 1970   Serafina Royals, PT 11/10/2015 3:51 PM  PHYSICAL THERAPY DISCHARGE SUMMARY  Visits from Start of Care: 1  Current functional level related to goals / functional outcomes: Unknown:  The patient came in for evaluation.  She was encouraged to attend After Breast Cancer class and do yoga.  The plan was for her to return in four weeks for follow-up status check, in part because she wanted to be accountable to someone.  However, she did not return.   Remaining deficits: Unknown.   Education / Equipment: HEP, okay to do yoga, about ABC class Plan: Patient agrees to discharge.  Patient goals were not met.  Patient is being discharged due to not returning since the last visit.  ?????    Serafina Royals, PT 09/06/17 1:10 PM

## 2015-11-11 ENCOUNTER — Other Ambulatory Visit: Payer: Self-pay | Admitting: General Surgery

## 2015-11-11 ENCOUNTER — Ambulatory Visit
Admission: RE | Admit: 2015-11-11 | Discharge: 2015-11-11 | Disposition: A | Discharge status: Home / Self Care | Payer: No Typology Code available for payment source | Source: Ambulatory Visit | Attending: General Surgery | Admitting: General Surgery

## 2015-11-11 DIAGNOSIS — R2232 Localized swelling, mass and lump, left upper limb: Secondary | ICD-10-CM

## 2015-11-11 DIAGNOSIS — Z9012 Acquired absence of left breast and nipple: Secondary | ICD-10-CM

## 2015-11-24 ENCOUNTER — Telehealth: Payer: Self-pay | Admitting: Hematology

## 2015-11-24 ENCOUNTER — Other Ambulatory Visit (HOSPITAL_BASED_OUTPATIENT_CLINIC_OR_DEPARTMENT_OTHER): Payer: No Typology Code available for payment source

## 2015-11-24 ENCOUNTER — Ambulatory Visit (HOSPITAL_BASED_OUTPATIENT_CLINIC_OR_DEPARTMENT_OTHER): Payer: No Typology Code available for payment source | Admitting: Hematology

## 2015-11-24 ENCOUNTER — Encounter: Payer: Self-pay | Admitting: Hematology

## 2015-11-24 VITALS — BP 94/40 | HR 45 | Temp 98.5°F | Resp 18 | Ht 68.0 in | Wt 122.7 lb

## 2015-11-24 DIAGNOSIS — C50212 Malignant neoplasm of upper-inner quadrant of left female breast: Secondary | ICD-10-CM

## 2015-11-24 DIAGNOSIS — Z170 Estrogen receptor positive status [ER+]: Secondary | ICD-10-CM

## 2015-11-24 LAB — COMPREHENSIVE METABOLIC PANEL
ANION GAP: 6 meq/L (ref 3–11)
AST: 17 U/L (ref 5–34)
Albumin: 3.8 g/dL (ref 3.5–5.0)
Alkaline Phosphatase: 61 U/L (ref 40–150)
BILIRUBIN TOTAL: 0.5 mg/dL (ref 0.20–1.20)
BUN: 12.5 mg/dL (ref 7.0–26.0)
CALCIUM: 9 mg/dL (ref 8.4–10.4)
CO2: 26 mEq/L (ref 22–29)
CREATININE: 0.8 mg/dL (ref 0.6–1.1)
Chloride: 107 mEq/L (ref 98–109)
EGFR: 87 mL/min/{1.73_m2} — ABNORMAL LOW (ref >90)
Glucose: 81 mg/dl (ref 70–140)
Potassium: 3.9 mEq/L (ref 3.5–5.1)
Sodium: 139 mEq/L (ref 136–145)
TOTAL PROTEIN: 6.3 g/dL — AB (ref 6.4–8.3)

## 2015-11-24 LAB — CBC WITH DIFFERENTIAL/PLATELET
BASO%: 0.9 % (ref 0.0–2.0)
Basophils Absolute: 0.1 10e3/uL (ref 0.0–0.1)
EOS%: 1.3 % (ref 0.0–7.0)
Eosinophils Absolute: 0.1 10e3/uL (ref 0.0–0.5)
HCT: 34.9 % (ref 34.8–46.6)
HGB: 11.6 g/dL (ref 11.6–15.9)
LYMPH%: 32.2 % (ref 14.0–49.7)
MCH: 30.5 pg (ref 25.1–34.0)
MCHC: 33.2 g/dL (ref 31.5–36.0)
MCV: 91.8 fL (ref 79.5–101.0)
MONO#: 0.5 10e3/uL (ref 0.1–0.9)
MONO%: 9.6 % (ref 0.0–14.0)
NEUT#: 3.1 10e3/uL (ref 1.5–6.5)
NEUT%: 56 % (ref 38.4–76.8)
Platelets: 199 10e3/uL (ref 145–400)
RBC: 3.8 10e6/uL (ref 3.70–5.45)
RDW: 13.2 % (ref 11.2–14.5)
WBC: 5.5 10e3/uL (ref 3.9–10.3)
lymph#: 1.8 10e3/uL (ref 0.9–3.3)

## 2015-11-24 MED ORDER — TAMOXIFEN CITRATE 20 MG PO TABS: 20.0000 mg | ORAL_TABLET | Freq: Every day | ORAL | Status: DC

## 2015-11-24 NOTE — Telephone Encounter (Signed)
Gave and printed appt sched and avs for pt for July  °

## 2015-11-24 NOTE — Progress Notes (Signed)
[place]  [phone] [phone]  Clinic Follow up Note   Patient Care Team: Marius Ditch, MD as PCP - General (Internal Medicine) Sylvan Cheese, NP as Nurse Practitioner (Hematology and Oncology) Rolm Bookbinder, MD as Consulting Physician (General Surgery) Thea Silversmith, MD as Consulting Physician (Radiation Oncology) 11/24/2015  CHIEF COMPLAINTS:  Follow up left breast cancer  Oncology History   Breast cancer of upper-inner quadrant of left female breast [facility])   Staging form: Breast, AJCC 7th Edition     Clinical stage from 09/06/2015: Stage IA (T1c, N0, M0) - Signed by Truitt Merle, MD on 09/14/2015     Pathologic stage from 09/23/2015: Stage IA (T1c, N0, cM0) - Signed by Truitt Merle, MD on 10/11/2015        Breast cancer of upper-inner quadrant of left female breast ([place])   08/31/2015 Imaging Pt declined mammogram, [place] of left breast showed a 1.6 x 1 x 1.1 cm hypoechoic mass within the left breast at the 10:00 position. Axilla nodes appear to be normal.   09/06/2015 Initial Diagnosis Breast cancer of upper-inner quadrant of left female breast ([place])   09/06/2015 Initial Biopsy Ultrasound-guided left breast mass biopsy showed G1-2 invasive ductal carcinoma, DCIS with associated calcifications.   09/06/2015 Receptors her2 ER 100% positive, PR 100% positive, HER-2 negative, Ki-67 10%   09/23/2015 Surgery  bilateral mastectomy and left sentinel lymph node biopsy.   09/23/2015 Pathology Results  Left breast mastectomy showed invasive and in situ carcinoma, 1.8 cm, invasive carcinoma focally involves the anterior margin, 6 lymph nodes were negative.   09/23/2015 Oncotype testing Oncotype DX RS: 7, low risk, which predicts 10 year distant recurrence risk of 6% with tamoxifen alone.   10/18/2015 Surgery re-excision for positive margin, final pathology was negative for tumor cells.    HISTORY OF PRESENTING ILLNESS (09/14/2015):  Gabriela [name] 45  y.o. female is here because of Her newly diagnosed breast cancer. She is accompanied by her husband to our multidisciplinary clinic today.  She palpated a mass in left breast 2 months ago, no tenderness, skin or nipple change, also noticed intermittent palpitation, no chest paijn , dyspnea or other symptoms, she has good appetite and energy level, no weight loss. She was referred to [facility] him a and underwent a targeted ultrasound of left breast, which showed a 1.6 cm hypoechoic mass within the left breast at the 10 clock position, axillar was negative. She declined mammogram. She underwent ultrasound-guided left breast mass biopsy, which showed invasive ductal carcinoma, ER/PR positive, HER-2 negative.  Her mother died of recurrent metastatic breast cancer. She has decided to proceed with bilateral mastectomy.  GYN HISTORY  Menarchal: 12 LMP: 08/25/2015  Contraceptive: no  HRT: n/a  G2P2: 39 and 72 yo sons   CURRENT THERAPY: Pending adjuvant tamoxifen  INTERIM HISTORY: Gabriela [name] returns for follow-up. She had reexcision for positive anterior marginby Dr. Donne Hazel on04/24/2017, and surgical past was negative for tumor cells. She did have qite bit pain after the second surgery, and has recovered well. The incision is well-healed. The range of motion of her left shoulder is still limited, but otherwise she has recovered well from her surgery. No other new complaints.   MEDICAL HISTORY:  Past Medical History  Diagnosis Date  . Depression   . Breast cancer of upper-inner quadrant of left female breast ([place]) 09/09/2015  . Anemia     "mild" (09/23/2015)  . Chronic left shoulder pain   .  Neuromuscular disorder ([place])     nerve pain under left arm  . Family history of breast cancer     SURGICAL HISTORY: Past Surgical History  Procedure Laterality Date  . Wisdom tooth extraction    . Mastectomy complete / simple w/ sentinel node biopsy Left 09/23/2015    AXILLARY  SENTINEL LYMPH NODE BIOPSY  . Mastectomy complete / simple Right 09/23/2015    PROPHYLACTIC   . Tonsillectomy    . Breast surgery Left 2017  . Mastectomy w/ sentinel node biopsy Bilateral 09/23/2015    Procedure: LEFT TOTAL MASTECTOMY WITH LEFT AXILLARY SENTINEL LYMPH NODE BIOPSY, RIGHT PROPHYLACTIC TOTAL MASTECTOMY;  Surgeon: Rolm Bookbinder, MD;  Location: [place];  Service: General;  Laterality: Bilateral;  . Re-excision of breast cancer,superior margins N/A 10/18/2015    Procedure: EXCISION OF SKIN FOR CLOSE MARGIN--ANTERIOR MARGIN;  Surgeon: Rolm Bookbinder, MD;  Location: [place];  Service: General;  Laterality: N/A;    SOCIAL HISTORY: Social History   Social History  . Marital Status: Married    Spouse Name: N/A  . Number of Children: 2  . Years of Education: N/A   Occupational History  . Not on file.   Social History Main Topics  . Smoking status: Former Smoker -- 1.00 packs/day for 10 years    Types: Cigarettes    Quit date: 06/27/1995  . Smokeless tobacco: Never Used  . Alcohol Use: Yes     Comment: 09/23/2015 was 3 glasses of wine/wk; nothing in the last month"  . Drug Use: No  . Sexual Activity: Not Currently   Other Topics Concern  . 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    FAMILY HISTORY: Family History  Problem Relation Age of Onset  . Cancer Mother   . Breast cancer Mother 21  . Breast cancer Maternal Grandmother 14  . Congestive Heart Failure Maternal Grandmother   . Lung cancer Maternal Grandfather     ALLERGIES:  is allergic to sulfa antibiotics.  MEDICATIONS:  Current Outpatient Prescriptions  Medication Sig Dispense Refill  . Ascorbic Acid (VITAMIN C) 1000 MG tablet Take 1,000 mg by mouth 2 (two) times daily.    Marland Kitchen b complex vitamins tablet Take 1 tablet by mouth daily.    . cholecalciferol (VITAMIN D) 1000 units tablet  Take 2,000 Units by mouth daily.    . Omega-3 Fatty Acids (OMEGA 3 PO) Take 2 tablets by mouth daily.    Marland Kitchen selenium 50 MCG TABS tablet Take 50 mcg by mouth daily.    Marland Kitchen UNABLE TO FIND Iodine 4 drops daily by  mouth    . tamoxifen (NOLVADEX) 20 MG tablet Take 1 tablet (20 mg total) by mouth daily. 30 tablet 2   No current facility-administered medications for this visit.    REVIEW OF SYSTEMS:   Constitutional: Denies fevers, chills or abnormal night sweats Eyes: Denies blurriness of vision, double vision or watery eyes Ears, nose, mouth, throat, and face: Denies mucositis or sore throat Respiratory: Denies cough, dyspnea or wheezes Cardiovascular: Denies palpitation, chest discomfort or lower extremity swelling Gastrointestinal:  Denies nausea, heartburn or change in bowel habits Skin: Denies abnormal skin rashes Lymphatics: Denies new lymphadenopathy or easy bruising Neurological:Denies numbness, tingling or new weaknesses Behavioral/Psych: Mood is stable, no new changes  All other systems were reviewed with the patient and are negative.  PHYSICAL EXAMINATION: ECOG PERFORMANCE STATUS: 0 - Asymptomatic  Filed Vitals:   11/24/15 1149  BP: 94/40  Pulse: 45  Temp: 98.5 F (36.9 C)  Resp: 18   Filed Weights   11/24/15 1149  Weight: 122 lb 11.2 oz (55.656 kg)    GENERAL:alert, no distress and comfortable SKIN: skin color, texture, turgor are normal, no rashes or significant lesions EYES: normal, conjunctiva are pink and non-injected, sclera clear OROPHARYNX:no exudate, no erythema and lips, buccal mucosa, and tongue normal  NECK: supple, thyroid normal size, non-tender, without nodularity LYMPH:  no palpable lymphadenopathy in the cervical, axillary or inguinal LUNGS: clear to auscultation and percussion with normal breathing effort HEART: regular rate & rhythm and no murmurs and no lower extremity edema ABDOMEN:abdomen soft, non-tender and normal bowel  sounds Musculoskeletal:no cyanosis of digits and no clubbing  PSYCH: alert & oriented x 3 with fluent speech NEURO: no focal motor/sensory deficits Breasts: status post bilateral mastectomy. Surgical incisions are healing well,  palpitation of the chest wall and both axilla reveal no palpable mass or adenopathy.    LABORATORY DATA:  I have reviewed the data as listed CBC Latest Ref Rng 11/24/2015 09/20/2015 09/15/2015  WBC 3.9 - 10.3 10e3/uL 5.5 7.8 5.8  Hemoglobin 11.6 - 15.9 g/dL 11.6 11.5(L) 13.3  Hematocrit 34.8 - 46.6 % 34.9 34.6(L) 40.3  Platelets 145 - 400 10e3/uL 199 242 236    CMP Latest Ref Rng 11/24/2015 09/20/2015 09/15/2015  Glucose 70 - 140 mg/dl 81 88 77  BUN 7.0 - 26.0 mg/dL 12.5 9 14.2  Creatinine 0.6 - 1.1 mg/dL 0.8 0.88 0.9  Sodium 136 - 145 mEq/L 139 138 141  Potassium 3.5 - 5.1 mEq/L 3.9 3.7 3.7  Chloride 101 - 111 mmol/L - 105 -  CO2 22 - 29 mEq/L '26 23 27  ' Calcium 8.4 - 10.4 mg/dL 9.0 9.2 9.4  Total Protein 6.4 - 8.3 g/dL 6.3(L) - 7.5  Total Bilirubin 0.20 - 1.20 mg/dL 0.50 - 0.81  Alkaline Phos 40 - 150 U/L 61 - 53  AST 5 - 34 U/L 17 - 19  ALT 0 - 55 U/L <9 - 10      PATHOLOGY REPORT  Diagnosis 09/23/2015 1. Breast, simple mastectomy, Right Breast Prophylactic Total Mastectomy, long stitch lateral, short stitch superior - FIBROCYSTIC CHANGES WITH CALCIFICATIONS. - NO EVIDENCE OF MALIGNANCY. 2. Breast, simple mastectomy, Left Breast Mastectomy for Cancer - INVASIVE AND IN SITU DUCTAL CARCINOMA, 1.8 CM. - INVASIVE CARCINOMA FOCALLY INVOLVES ANTERIOR MARGIN. - INVASIVE AND IN SITU CARCINOMA FOCALLY 0.2 CM FROM POSTERIOR MARGIN. - FIBROCYSTIC CHANGES. - BIOPSY SITE CHANGES. 3. Lymph node, sentinel, biopsy, Left Axillary - ONE BENIGN LYMPH NODE (0/1). 4. Lymph node, sentinel, biopsy, SLN - ONE BENIGN LYMPH NODE (0/1). 5. Lymph node, sentinel, biopsy, SLN - ONE BENIGN LYMPH NODE (0/1). 6. Lymph node, sentinel, biopsy, SLN - ONE BENIGN LYMPH NODE  (0/1). 7. Lymph node, sentinel, biopsy, SLN - ONE BENIGN LYMPH NODE (0/1). 8. Lymph node, sentinel, biopsy, SLN - ONE BENIGN LYMPH NODE (0/1). Microscopic Comment 2. BREAST, INVASIVE TUMOR, WITH LYMPH NODES PRESENT Specimen, including laterality and lymph node sampling (sentinel, non-sentinel): Left breast and five sentinel lymph nodes. Procedure: Mastectomy and sentinel lymph node biopsies. Histologic type: Ductal. Grade: 1 Tubule formation: 2 Nuclear pleomorphism: 2 Mitotic:1 Tumor size (gross measurement): 1.8 cm Margins: Focally involved by invasive carcinoma. Invasive, distance to closest margin: Anterior margin focally involved. Posterior margin is 0.2 cm. In-situ, distance to closest margin: Less  than 0.1 cm from anterior margin and 0.2 cm from posterior margin. If margin positive, focally or broadly: Focally. Lymphovascular invasion: No Ductal carcinoma in situ: Present. Grade: Intermediate. Extensive intraductal component: No Lobular neoplasia: No Tumor focality: Unifocal. Treatment effect: No If present, treatment effect in breast tissue, lymph nodes or both: N/A Extent of tumor: Skin: Free of tumor Nipple: N/A Skeletal muscle: N/A Lymph nodes: Examined: 5 Sentinel 0 Non-sentinel 5 Total Lymph nodes with metastasis: 0 Isolated tumor cells (< 0.2 mm): 0 Micrometastasis: (> 0.2 mm and < 2.0 mm): 0 Macrometastasis: (> 2.0 mm): 0 Extracapsular extension: N/A Breast prognostic profile: Case number [id number] Estrogen receptor: 100%, positive, moderate staining. Progesterone receptor: 100%, positive, strong staining. Her 2 neu: Negative, ratio 1.43. Will be repeated on current specimen. Ki-67: 10% Non-neoplastic breast: Fibrocystic changes. TNM: pT1c, pN0, pMX (JDP:ecj 09/27/2015)  Results: HER2 - NEGATIVE RATIO OF HER2/CEP17 SIGNALS 1.20 AVERAGE HER2 COPY NUMBER PER CELL 1.80 Reference Range: NEGATIVE HER2/CEP17 Ratio <2.0 and average HER2 copy number  <4.0 EQUIVOCAL HER2/CEP17 Ratio <2.0 and average HER2 copy number >=4.0 and <6.0 POSITIVE HER2/CEP17 Ratio >=2.0 or <2.0 and average HER2 copy number >=6.0  Oncotype DX RS: 7, low risk, which predicts 10 year distant recurrence risk of 6% with tamoxifen alone.   Diagnosis 10/18/2015 Skin , Left chest skin - FIBROSIS WITH FOCAL INFLAMMATION CONSISTENT WITH SCAR. - NO EVIDENCE OF MALIGNANCY. Microscopic Comment There is skin with underlying fibrosis and patchy chronic inflammation consistent with scar. Immunohistochemistry for cytokeratin AE1/AE3 is performed and does not reveal positivity within the scar. (JDP:ecj 10/19/2015)   RADIOGRAPHIC STUDIES: I have personally reviewed the radiological images as listed and agreed with the findings in the report.  No new scans   ASSESSMENT & PLAN: 45 year old premenopausal woman, family history of breast cancer in her mother, presented with a palpable left breast mass.  1. 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 -She has had early stage breast cancer, low-grade, ER+/PR+/HER2-, her risk of recurrence is low (6% 10 year distant recurrence risk with tamoxifen), she likely will do very well. -she underwent  reexcision for positive anterior margin, and the final surgical pathology was negative for tumor cells.  -Adjuvant chemotherapy was not recommended giving the low risk RS on Oncotype  -she has recovered well from her breast surgery, I recommend her to start the tamoxifen. Potential side effects were reviewed with her again, she agrees to proceed. I called in tamoxifen 20 mg once daily to her pharmacy today, she is sensitive to medication, OK to start with 10 mg once daily for a week then increase to 20 million daily if she tolerates well. - Will continue breast cancer  surveillance.  2. Genetics -Due to her young age and family history of breast cancer, I have referred her to Dietitian. She was seen on 5/15.    Plan -start tamoxifen in a few days -I'll see her back in 6 weeks for follow-up.   All questions were answered. The patient knows to call the clinic with any problems, questions or concerns.  I spent 25 minutes counseling the patient face to face. The total time spent in the appointment was 30 minutes and more than 50% was on counseling.     Truitt Merle, MD 11/24/2015 2:17 PM

## 2015-11-25 ENCOUNTER — Encounter: Payer: Self-pay | Admitting: Genetic Counselor

## 2015-11-25 ENCOUNTER — Telehealth: Payer: Self-pay | Admitting: Genetic Counselor

## 2015-11-25 DIAGNOSIS — Z1379 Encounter for other screening for genetic and chromosomal anomalies: Secondary | ICD-10-CM | POA: Insufficient documentation

## 2015-11-25 LAB — VITAMIN D 25 HYDROXY (VIT D DEFICIENCY, FRACTURES): VIT D 25 HYDROXY: 29.7 ng/mL — AB (ref 30.0–100.0)

## 2015-11-25 NOTE — Telephone Encounter (Signed)
Mailbox is full and cannot accept messages. 

## 2015-11-26 ENCOUNTER — Telehealth: Payer: Self-pay | Admitting: Genetic Counselor

## 2015-11-26 ENCOUNTER — Ambulatory Visit: Payer: Self-pay | Admitting: Genetic Counselor

## 2015-11-26 DIAGNOSIS — Z1379 Encounter for other screening for genetic and chromosomal anomalies: Secondary | ICD-10-CM

## 2015-11-26 DIAGNOSIS — C50212 Malignant neoplasm of upper-inner quadrant of left female breast: Secondary | ICD-10-CM

## 2015-11-26 DIAGNOSIS — Z803 Family history of malignant neoplasm of breast: Secondary | ICD-10-CM

## 2015-11-26 NOTE — Telephone Encounter (Signed)
Revealed negative genetic testing on the Breast/Ovarian cancer panel.  I will email her a copy of her result.

## 2015-11-26 NOTE — Progress Notes (Signed)
HPI: [name] was previously seen in the [place] clinic due to a personal and family history of breast cancer and concerns regarding a hereditary predisposition to cancer. Please refer to our prior cancer genetics clinic note for more information regarding Gabriela [name]'s medical, social and family histories, and our assessment and recommendations, at the time. [name] recent genetic test results were disclosed to her, as were recommendations warranted by these results. These results and recommendations are discussed in more detail below.  FAMILY HISTORY:  We obtained a detailed, 4-generation family history.  Significant diagnoses are listed below: Family History  Problem Relation Age of Onset  . Cancer Mother   . Breast cancer Mother 69  . Breast cancer Maternal Grandmother 72  . Congestive Heart Failure Maternal Grandmother   . Lung cancer Maternal Grandfather     The patient has two sons who are cancer free. She also has one healthy cancer free brother. The patient's mother was diagnosed with breast cancer at 44 and died at 11. She has one brother who is healthy and cancer free. The patient's maternal grandmother had breast cancer at 3 and died of congestive heart failure at age 55. Her grandfather died of lung cancer in his 26s. The patient's father is adopted and no information is known about his side of the family. Patient's maternal ancestors are of [place] descent, and paternal ancestors are of Caucasian descent. There is no reported Ashkenazi Jewish ancestry. There is no known consanguinity.  GENETIC TEST RESULTS: At the time of Gabriela [name]'s visit, we recommended she pursue genetic testing of the Breast/Ovarian cancer gene panel. The Breast/Ovarian gene panel offered by [facility] includes sequencing and rearrangement analysis for the following 20 genes:  ATM, BARD1, BRCA1, BRCA2, BRIP1, CDH1, CHEK2, EPCAM, FANCC, MLH1, MSH2, MSH6, NBN, PALB2, PMS2, PTEN,  RAD51C, RAD51D, TP53, and XRCC2.   The report date is Nov 24, 2015.  Genetic testing was normal, and did not reveal a deleterious mutation in these genes. The test report has been scanned into EPIC and is located under the Molecular Pathology section of the Results Review tab.   We discussed with [name] that since the current genetic testing is not perfect, it is possible there may be a gene mutation in one of these genes that current testing cannot detect, but that chance is small. We also discussed, that it is possible that another gene that has not yet been discovered, or that we have not yet tested, is responsible for the cancer diagnoses in the family, and it is, therefore, important to remain in touch with cancer genetics in the future so that we can continue to offer [name] the most up to date genetic testing.   CANCER SCREENING RECOMMENDATIONS:   Given Gabriela [name]'s personal and family histories, we must interpret these negative results with some caution.  Families with features suggestive of hereditary risk for cancer tend to have multiple family members with cancer, diagnoses in multiple generations and diagnoses before the age of 31. [name] family exhibits some of these features. Thus this result may simply reflect our current inability to detect all mutations within these genes or there may be a different gene that has not yet been discovered or tested.   RECOMMENDATIONS FOR FAMILY MEMBERS: Women in this family might be at some increased risk of developing cancer, over the general population risk, simply due to the family history of cancer. We recommended women in this family have a  yearly mammogram beginning at age 46, or 46 years younger than the earliest onset of cancer, an an annual clinical breast exam, and perform monthly breast self-exams. Women in this family should also have a gynecological exam as recommended by their primary provider. All family members should have a  colonoscopy by age 60.  FOLLOW-UP: Lastly, we discussed with [name] that cancer genetics is a rapidly advancing field and it is possible that new genetic tests will be appropriate for her and/or her family members in the future. We encouraged her to remain in contact with cancer genetics on an annual basis so we can update her personal and family histories and let her know of advances in cancer genetics that may benefit this family.   Our contact number was provided. [name] questions were answered to her satisfaction, and she knows she is welcome to call us at anytime with additional questions or concerns.   Roma Kayser, MS, [facility] Certified Genetic Counselor [email or web address]

## 2015-12-08 ENCOUNTER — Ambulatory Visit: Payer: No Typology Code available for payment source | Attending: General Surgery | Admitting: Physical Therapy

## 2016-01-08 ENCOUNTER — Encounter (HOSPITAL_COMMUNITY): Payer: Self-pay

## 2016-01-12 ENCOUNTER — Encounter: Payer: Self-pay | Admitting: Hematology

## 2016-01-12 ENCOUNTER — Encounter: Payer: No Typology Code available for payment source | Admitting: Hematology

## 2016-01-12 ENCOUNTER — Other Ambulatory Visit: Payer: No Typology Code available for payment source

## 2016-01-12 NOTE — Progress Notes (Signed)
No show   This encounter was created in error - please disregard.  This encounter was created in error - please disregard.

## 2016-01-15 ENCOUNTER — Telehealth: Payer: Self-pay | Admitting: Hematology

## 2016-01-15 NOTE — Telephone Encounter (Signed)
Called pt to advise of [facility] appts r/s'd to 8/11. No answer, unable to leave vm. Mailed appt calendar.

## 2016-01-28 ENCOUNTER — Telehealth: Payer: Self-pay | Admitting: Hematology

## 2016-01-28 NOTE — Telephone Encounter (Signed)
Patient called to reschedule appointment from 02/04/16 to 03/23/16. Appointment letter and schedule mailed. Merleen Nicely.

## 2016-02-04 ENCOUNTER — Ambulatory Visit: Payer: No Typology Code available for payment source | Admitting: Hematology

## 2016-02-04 ENCOUNTER — Other Ambulatory Visit: Payer: No Typology Code available for payment source

## 2016-02-11 ENCOUNTER — Telehealth: Payer: Self-pay | Admitting: Genetic Counselor

## 2016-02-11 NOTE — Telephone Encounter (Signed)
Patient revealed she received a bill from [facility] and thought it was from her genetic testing.  Discussed that this was from her oncotype testing and transferred her to Dr. Ernestina Penna desk nurse.

## 2016-03-21 ENCOUNTER — Telehealth: Payer: Self-pay | Admitting: Hematology

## 2016-03-21 NOTE — Telephone Encounter (Signed)
09/28 Appointment rescheduled to 10/12 per patient request to have early AM Thursday appointment or late afternoon Friday appointment. Preferably the Thursday AM appointment per work schedule.

## 2016-03-23 ENCOUNTER — Ambulatory Visit: Payer: No Typology Code available for payment source | Admitting: Hematology

## 2016-03-23 ENCOUNTER — Other Ambulatory Visit: Payer: No Typology Code available for payment source

## 2016-04-06 ENCOUNTER — Ambulatory Visit (HOSPITAL_BASED_OUTPATIENT_CLINIC_OR_DEPARTMENT_OTHER): Payer: No Typology Code available for payment source | Admitting: Hematology

## 2016-04-06 ENCOUNTER — Telehealth: Payer: Self-pay | Admitting: Hematology

## 2016-04-06 ENCOUNTER — Other Ambulatory Visit (HOSPITAL_BASED_OUTPATIENT_CLINIC_OR_DEPARTMENT_OTHER): Payer: No Typology Code available for payment source

## 2016-04-06 VITALS — BP 110/63 | HR 55 | Temp 98.9°F | Resp 16 | Ht 68.0 in | Wt 121.7 lb

## 2016-04-06 DIAGNOSIS — Z170 Estrogen receptor positive status [ER+]: Secondary | ICD-10-CM | POA: Diagnosis not present

## 2016-04-06 DIAGNOSIS — Z79810 Long term (current) use of selective estrogen receptor modulators (SERMs): Secondary | ICD-10-CM

## 2016-04-06 DIAGNOSIS — C50212 Malignant neoplasm of upper-inner quadrant of left female breast: Secondary | ICD-10-CM | POA: Diagnosis not present

## 2016-04-06 LAB — CBC WITH DIFFERENTIAL/PLATELET
BASO%: 0.5 % (ref 0.0–2.0)
Basophils Absolute: 0 10*3/uL (ref 0.0–0.1)
EOS ABS: 0.1 10*3/uL (ref 0.0–0.5)
EOS%: 1.9 % (ref 0.0–7.0)
HEMATOCRIT: 37.3 % (ref 34.8–46.6)
HEMOGLOBIN: 12.6 g/dL (ref 11.6–15.9)
LYMPH#: 2.2 10*3/uL (ref 0.9–3.3)
LYMPH%: 35 % (ref 14.0–49.7)
MCH: 30.1 pg (ref 25.1–34.0)
MCHC: 33.8 g/dL (ref 31.5–36.0)
MCV: 89.2 fL (ref 79.5–101.0)
MONO#: 0.6 10*3/uL (ref 0.1–0.9)
MONO%: 10 % (ref 0.0–14.0)
NEUT%: 52.6 % (ref 38.4–76.8)
NEUTROS ABS: 3.3 10*3/uL (ref 1.5–6.5)
PLATELETS: 222 10*3/uL (ref 145–400)
RBC: 4.18 10*6/uL (ref 3.70–5.45)
RDW: 13.5 % (ref 11.2–14.5)
WBC: 6.2 10*3/uL (ref 3.9–10.3)

## 2016-04-06 LAB — COMPREHENSIVE METABOLIC PANEL
ALBUMIN: 4 g/dL (ref 3.5–5.0)
ALK PHOS: 61 U/L (ref 40–150)
ALT: 10 U/L (ref 0–55)
ANION GAP: 10 meq/L (ref 3–11)
AST: 17 U/L (ref 5–34)
BILIRUBIN TOTAL: 0.58 mg/dL (ref 0.20–1.20)
BUN: 13.3 mg/dL (ref 7.0–26.0)
CALCIUM: 9.3 mg/dL (ref 8.4–10.4)
CO2: 23 meq/L (ref 22–29)
CREATININE: 0.8 mg/dL (ref 0.6–1.1)
Chloride: 109 mEq/L (ref 98–109)
EGFR: 84 mL/min/{1.73_m2} — AB (ref >90)
Glucose: 99 mg/dl (ref 70–140)
Potassium: 3.8 mEq/L (ref 3.5–5.1)
Sodium: 141 mEq/L (ref 136–145)
TOTAL PROTEIN: 6.8 g/dL (ref 6.4–8.3)

## 2016-04-06 NOTE — Progress Notes (Signed)
[place]  [phone] [phone]) [phone]  Clinic Follow up Note   Patient Care Team: Marius Ditch, MD as PCP - General (Internal Medicine) Sylvan Cheese, NP as Nurse Practitioner (Hematology and Oncology) Rolm Bookbinder, MD as Consulting Physician (General Surgery) Thea Silversmith, MD as Consulting Physician (Radiation Oncology) 04/06/2016  CHIEF COMPLAINTS:  Follow up left breast cancer  Oncology History   Breast cancer of upper-inner quadrant of left female breast [facility])   Staging form: Breast, AJCC 7th Edition     Clinical stage from 09/06/2015: Stage IA (T1c, N0, M0) - Signed by Truitt Merle, MD on 09/14/2015     Pathologic stage from 09/23/2015: Stage IA (T1c, N0, cM0) - Signed by Truitt Merle, MD on 10/11/2015        Breast cancer of upper-inner quadrant of left female breast ([place])   08/31/2015 Imaging    Pt declined mammogram, [place] of left breast showed a 1.6 x 1 x 1.1 cm hypoechoic mass within the left breast at the 10:00 position. Axilla nodes appear to be normal.      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      09/06/2015 Receptors her2    ER 100% positive, PR 100% positive, HER-2 negative, Ki-67 10%      09/23/2015 Surgery     bilateral mastectomy and left sentinel lymph node biopsy.      09/23/2015 Pathology Results     Left breast mastectomy showed invasive and in situ carcinoma, 1.8 cm, invasive carcinoma focally involves the anterior margin, 6 lymph nodes were negative.      09/23/2015 Oncotype testing    Oncotype DX RS: 7, low risk, which predicts 10 year distant recurrence risk of 6% with tamoxifen alone.      10/18/2015 Surgery    re-excision for positive margin, final pathology was negative for tumor cells.      02/25/2016 -  Anti-estrogen oral therapy    Tamoxifen 20 mg once daily. She did not start until early Sep per her own choice.        HISTORY OF PRESENTING ILLNESS (09/14/2015):  Gabriela [name] 45 y.o. female is here because of Her newly diagnosed breast cancer. She is accompanied by her husband to our multidisciplinary clinic today.  She palpated a mass in left breast 2 months ago, no tenderness, skin or nipple change, also noticed intermittent palpitation, no chest paijn , dyspnea or other symptoms, she has good appetite and energy level, no weight loss. She was referred to [facility] him a and underwent a targeted ultrasound of left breast, which showed a 1.6 cm hypoechoic mass within the left breast at the 10 clock position, axillar was negative. She declined mammogram. She underwent ultrasound-guided left breast mass biopsy, which showed invasive ductal carcinoma, ER/PR positive, HER-2 negative.  Her mother died of recurrent metastatic breast cancer. She has decided to proceed with bilateral mastectomy.  GYN HISTORY  Menarchal: 12 LMP: 08/25/2015  Contraceptive: no  HRT: n/a  G2P2: 59 and 65 yo sons   CURRENT THERAPY: adjuvant tamoxifen 20 mg once daily, she started in early September 2017  INTERIM HISTORY: Gabriela [name] returns for follow-up. She was last seen by me about 5 months ago, and she rescheduled her follow-up appointment a few  times. She has gone through a lot of stress lately, she had a multiple episodes of high infection, was seen by ophthalmologist, treated with antibiotics, she believes she still has mild infection. She started her college study last month, which is very demanding and stressful, she also works part-time and night, she feels very stressed out to her own house issue, school, work and her family life. She denies suicidal or depression. She did not start tamoxifen until a month ago, due to her concern of high infection. She has been tolerating  tamoxifen well so far, no significant hot flashes or other noticeable side effects.   MEDICAL HISTORY:  Past Medical History:  Diagnosis Date  . Anemia    "mild" (09/23/2015)  . Breast cancer of upper-inner quadrant of left female breast ([place]) 09/09/2015  . Chronic left shoulder pain   . Depression   . Family history of breast cancer   . Neuromuscular disorder ([place])    nerve pain under left arm    SURGICAL HISTORY: Past Surgical History:  Procedure Laterality Date  . BREAST SURGERY Left 2017  . MASTECTOMY COMPLETE / SIMPLE Right 09/23/2015   PROPHYLACTIC   . MASTECTOMY COMPLETE / SIMPLE W/ SENTINEL NODE BIOPSY Left 09/23/2015   AXILLARY SENTINEL LYMPH NODE BIOPSY  . MASTECTOMY W/ SENTINEL NODE BIOPSY Bilateral 09/23/2015   Procedure: LEFT TOTAL MASTECTOMY WITH LEFT AXILLARY SENTINEL LYMPH NODE BIOPSY, RIGHT PROPHYLACTIC TOTAL MASTECTOMY;  Surgeon: Rolm Bookbinder, MD;  Location: [place];  Service: General;  Laterality: Bilateral;  . RE-EXCISION OF BREAST CANCER,SUPERIOR MARGINS N/A 10/18/2015   Procedure: EXCISION OF SKIN FOR CLOSE MARGIN--ANTERIOR MARGIN;  Surgeon: Rolm Bookbinder, MD;  Location: [place];  Service: General;  Laterality: N/A;  . TONSILLECTOMY    . WISDOM TOOTH EXTRACTION      SOCIAL HISTORY: Social History   Social History  . Marital status: Married    Spouse name: N/A  . Number of children: 2  . Years of education: N/A   Occupational History  . Not on file.   Social History Main Topics  . Smoking status: Former Smoker    Packs/day: 1.00    Years: 10.00    Types: Cigarettes    Quit date: 06/27/1995  . Smokeless tobacco: Never Used  . Alcohol use Yes     Comment: 09/23/2015 was 3 glasses of wine/wk; nothing in the last month"  . Drug use: No  . Sexual activity: Not Currently   Other Topics Concern  . Not on file   Social History Narrative  . 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    FAMILY HISTORY: Family History  Problem Relation Age of Onset  . Cancer Mother   . Breast cancer Mother 26  . Breast cancer Maternal Grandmother 62  . Congestive Heart Failure Maternal Grandmother   . Lung cancer Maternal Grandfather     ALLERGIES:  is allergic to sulfa antibiotics.  MEDICATIONS:  Current Outpatient Prescriptions  Medication Sig Dispense Refill  . Ascorbic Acid (VITAMIN C) 1000 MG tablet Take 1,000 mg by mouth 2 (two) times daily.    Marland Kitchen b complex vitamins tablet Take 1 tablet by mouth daily.    . cholecalciferol (VITAMIN D) 1000 units tablet Take 2,000 Units by mouth daily.    . Omega-3 Fatty Acids (OMEGA 3 PO) Take 2 tablets by mouth daily.    Marland Kitchen  selenium 50 MCG TABS tablet Take 50 mcg by mouth daily.    . tamoxifen (NOLVADEX) 20 MG tablet Take 1 tablet (20 mg total) by mouth daily. 30 tablet 2  . UNABLE TO FIND Iodine 4 drops daily by  mouth     No current facility-administered medications for this visit.     REVIEW OF SYSTEMS:   Constitutional: Denies fevers, chills or abnormal night sweats Eyes: Denies blurriness of vision, double vision or watery eyes Ears, nose, mouth, throat, and face: Denies mucositis or sore throat Respiratory: Denies cough, dyspnea or wheezes Cardiovascular: Denies palpitation, chest discomfort or lower extremity swelling Gastrointestinal:  Denies nausea, heartburn or change in bowel habits Skin: Denies abnormal skin rashes Lymphatics: Denies new lymphadenopathy or easy bruising Neurological:Denies numbness, tingling or new weaknesses Behavioral/Psych: Mood is stable, no new changes  All other systems were reviewed with the patient and are negative.  PHYSICAL EXAMINATION: ECOG PERFORMANCE STATUS: 0 - Asymptomatic  Vitals:   04/06/16 0939  BP: 110/63  Pulse: (!) 55  Resp: 16  Temp: 98.9 F (37.2 C)   Filed Weights   04/06/16 0939  Weight: 121 lb 11.2 oz (55.2 kg)     GENERAL:alert, no distress and comfortable SKIN: skin color, texture, turgor are normal, no rashes or significant lesions EYES: normal, conjunctiva are pink and non-injected, sclera clear OROPHARYNX:no exudate, no erythema and lips, buccal mucosa, and tongue normal  NECK: supple, thyroid normal size, non-tender, without nodularity LYMPH:  no palpable lymphadenopathy in the cervical, axillary or inguinal LUNGS: clear to auscultation and percussion with normal breathing effort HEART: regular rate & rhythm and no murmurs and no lower extremity edema ABDOMEN:abdomen soft, non-tender and normal bowel sounds Musculoskeletal:no cyanosis of digits and no clubbing  PSYCH: alert & oriented x 3 with fluent speech NEURO: no focal motor/sensory deficits Breasts: pt declined breast exam, she is s/p bilateral mastectomy without reconstruction   LABORATORY DATA:  I have reviewed the data as listed CBC Latest Ref Rng & Units 04/06/2016 11/24/2015 09/20/2015  WBC 3.9 - 10.3 10e3/uL 6.2 5.5 7.8  Hemoglobin 11.6 - 15.9 g/dL 12.6 11.6 11.5(L)  Hematocrit 34.8 - 46.6 % 37.3 34.9 34.6(L)  Platelets 145 - 400 10e3/uL 222 199 242    CMP Latest Ref Rng & Units 04/06/2016 11/24/2015 09/20/2015  Glucose 70 - 140 mg/dl 99 81 88  BUN 7.0 - 26.0 mg/dL 13.3 12.5 9  Creatinine 0.6 - 1.1 mg/dL 0.8 0.8 0.88  Sodium 136 - 145 mEq/L 141 139 138  Potassium 3.5 - 5.1 mEq/L 3.8 3.9 3.7  Chloride 101 - 111 mmol/L - - 105  CO2 22 - 29 mEq/L '23 26 23  ' Calcium 8.4 - 10.4 mg/dL 9.3 9.0 9.2  Total Protein 6.4 - 8.3 g/dL 6.8 6.3(L) -  Total Bilirubin 0.20 - 1.20 mg/dL 0.58 0.50 -  Alkaline Phos 40 - 150 U/L 61 61 -  AST 5 - 34 U/L 17 17 -  ALT 0 - 55 U/L 10 <9 -      PATHOLOGY REPORT  Diagnosis 09/23/2015 1. Breast, simple mastectomy, Right Breast Prophylactic Total Mastectomy, long stitch lateral, short stitch superior - FIBROCYSTIC CHANGES WITH CALCIFICATIONS. - NO EVIDENCE OF MALIGNANCY. 2. Breast, simple  mastectomy, Left Breast Mastectomy for Cancer - INVASIVE AND IN SITU DUCTAL CARCINOMA, 1.8 CM. - INVASIVE CARCINOMA FOCALLY INVOLVES ANTERIOR MARGIN. - INVASIVE AND IN SITU CARCINOMA FOCALLY 0.2 CM FROM POSTERIOR MARGIN. - FIBROCYSTIC CHANGES. - BIOPSY SITE CHANGES. 3. Lymph node, sentinel,  biopsy, Left Axillary - ONE BENIGN LYMPH NODE (0/1). 4. Lymph node, sentinel, biopsy, SLN - ONE BENIGN LYMPH NODE (0/1). 5. Lymph node, sentinel, biopsy, SLN - ONE BENIGN LYMPH NODE (0/1). 6. Lymph node, sentinel, biopsy, SLN - ONE BENIGN LYMPH NODE (0/1). 7. Lymph node, sentinel, biopsy, SLN - ONE BENIGN LYMPH NODE (0/1). 8. Lymph node, sentinel, biopsy, SLN - ONE BENIGN LYMPH NODE (0/1). Microscopic Comment 2. BREAST, INVASIVE TUMOR, WITH LYMPH NODES PRESENT Specimen, including laterality and lymph node sampling (sentinel, non-sentinel): Left breast and five sentinel lymph nodes. Procedure: Mastectomy and sentinel lymph node biopsies. Histologic type: Ductal. Grade: 1 Tubule formation: 2 Nuclear pleomorphism: 2 Mitotic:1 Tumor size (gross measurement): 1.8 cm Margins: Focally involved by invasive carcinoma. Invasive, distance to closest margin: Anterior margin focally involved. Posterior margin is 0.2 cm. In-situ, distance to closest margin: Less than 0.1 cm from anterior margin and 0.2 cm from posterior margin. If margin positive, focally or broadly: Focally. Lymphovascular invasion: No Ductal carcinoma in situ: Present. Grade: Intermediate. Extensive intraductal component: No Lobular neoplasia: No Tumor focality: Unifocal. Treatment effect: No If present, treatment effect in breast tissue, lymph nodes or both: N/A Extent of tumor: Skin: Free of tumor Nipple: N/A Skeletal muscle: N/A Lymph nodes: Examined: 5 Sentinel 0 Non-sentinel 5 Total Lymph nodes with metastasis: 0 Isolated tumor cells (< 0.2 mm): 0 Micrometastasis: (> 0.2 mm and < 2.0 mm): 0 Macrometastasis: (> 2.0 mm):  0 Extracapsular extension: N/A Breast prognostic profile: Case number [id number] Estrogen receptor: 100%, positive, moderate staining. Progesterone receptor: 100%, positive, strong staining. Her 2 neu: Negative, ratio 1.43. Will be repeated on current specimen. Ki-67: 10% Non-neoplastic breast: Fibrocystic changes. TNM: pT1c, pN0, pMX (JDP:ecj 09/27/2015)  Results: HER2 - NEGATIVE RATIO OF HER2/CEP17 SIGNALS 1.20 AVERAGE HER2 COPY NUMBER PER CELL 1.80 Reference Range: NEGATIVE HER2/CEP17 Ratio <2.0 and average HER2 copy number <4.0 EQUIVOCAL HER2/CEP17 Ratio <2.0 and average HER2 copy number >=4.0 and <6.0 POSITIVE HER2/CEP17 Ratio >=2.0 or <2.0 and average HER2 copy number >=6.0  Oncotype DX RS: 7, low risk, which predicts 10 year distant recurrence risk of 6% with tamoxifen alone.   Diagnosis 10/18/2015 Skin , Left chest skin - FIBROSIS WITH FOCAL INFLAMMATION CONSISTENT WITH SCAR. - NO EVIDENCE OF MALIGNANCY. Microscopic Comment There is skin with underlying fibrosis and patchy chronic inflammation consistent with scar. Immunohistochemistry for cytokeratin AE1/AE3 is performed and does not reveal positivity within the scar. (JDP:ecj 10/19/2015)   RADIOGRAPHIC STUDIES: I have personally reviewed the radiological images as listed and agreed with the findings in the report.  No new scans   ASSESSMENT & PLAN: 45 year old premenopausal woman, family history of breast cancer in her mother, presented with a palpable left breast mass.  1. 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 -She has had early stage breast cancer, low-grade, ER+/PR+/HER2-, her risk of recurrence is low (6% 10 year distant recurrence risk with tamoxifen), she likely will do very well. -she underwent  reexcision for positive anterior margin, and the  final surgical pathology was negative for tumor cells.  -Adjuvant chemotherapy was not recommended giving the low risk RS on Oncotype  -her adjuvant tamoxifen was not started until 5 months after her surgery, per her own choice. She is tolerating well, we'll continue for 10 years. -If she experienced menopause in the next 5 years, I may consider switching her  to aromatase inhibitor. -Continue breast cancer surveillance. She does not need screening mammogram due to bilateral mastectomy, I encouraged her to do self exam, and a follow-up with lab and exam routinely. -I encouraged her to eat healthy, exercise regularly, and to be positive. She has had a lot of stress in her life lately. -I offered her social worker counseling, she declined.  2. Genetics -Due to her young age and family history of breast cancer, I have referred her to Dietitian. She was seen on 5/15.  -Her genetic testing was negative.  The Breast/Ovarian gene panel offered by [facility] includes sequencing and rearrangement analysis for the following 20 genes:  ATM, BARD1, BRCA1, BRCA2, BRIP1, CDH1, CHEK2, EPCAM, FANCC, MLH1, MSH2, MSH6, NBN, PALB2, PMS2, PTEN, RAD51C, RAD51D, TP53, and XRCC2.   Plan -Continue tamoxifen  -I'll see her back in 3 months with lab.   All questions were answered. The patient knows to call the clinic with any problems, questions or concerns.  I spent 25 minutes counseling the patient face to face. The total time spent in the appointment was 30 minutes and more than 50% was on counseling.     Truitt Merle, MD 04/06/2016

## 2016-04-06 NOTE — Telephone Encounter (Signed)
Gv pt appt for 07/06/16.

## 2016-04-09 ENCOUNTER — Encounter: Payer: Self-pay | Admitting: Hematology

## 2016-04-12 ENCOUNTER — Telehealth: Payer: Self-pay

## 2016-04-12 NOTE — Telephone Encounter (Signed)
Faxed disability form to [employer]

## 2016-07-05 NOTE — Progress Notes (Deleted)
[place]  [phone] [phone]  Clinic Follow up Note   Patient Care Team: Marius Ditch, MD as PCP - General (Internal Medicine) Sylvan Cheese, NP as Nurse Practitioner (Hematology and Oncology) Rolm Bookbinder, MD as Consulting Physician (General Surgery) Thea Silversmith, MD as Consulting Physician (Radiation Oncology) 07/05/2016  CHIEF COMPLAINTS:  Follow up left breast cancer  Oncology History   Breast cancer of upper-inner quadrant of left female breast [facility])   Staging form: Breast, AJCC 7th Edition     Clinical stage from 09/06/2015: Stage IA (T1c, N0, M0) - Signed by Truitt Merle, MD on 09/14/2015     Pathologic stage from 09/23/2015: Stage IA (T1c, N0, cM0) - Signed by Truitt Merle, MD on 10/11/2015        Breast cancer of upper-inner quadrant of left female breast ([place])   08/31/2015 Imaging    Pt declined mammogram, [place] of left breast showed a 1.6 x 1 x 1.1 cm hypoechoic mass within the left breast at the 10:00 position. Axilla nodes appear to be normal.      09/06/2015 Initial Diagnosis    Breast cancer of upper-inner quadrant of left female breast ([place])      09/06/2015 Initial Biopsy    Ultrasound-guided left breast mass biopsy showed G1-2 invasive ductal carcinoma, DCIS with associated calcifications.      09/06/2015 Receptors her2    ER 100% positive, PR 100% positive, HER-2 negative, Ki-67 10%      09/23/2015 Surgery     bilateral mastectomy and left sentinel lymph node biopsy.      09/23/2015 Pathology Results     Left breast mastectomy showed invasive and in situ carcinoma, 1.8 cm, invasive carcinoma focally involves the anterior margin, 6 lymph nodes were negative.      09/23/2015 Oncotype testing    Oncotype DX RS: 7, low risk, which predicts 10 year distant recurrence risk of 6% with tamoxifen alone.      10/18/2015 Surgery    re-excision for positive margin, final pathology was negative for tumor cells.      02/25/2016 -  Anti-estrogen oral therapy    Tamoxifen 20 mg once daily. She did not start until early Sep per her own choice.        HISTORY OF PRESENTING ILLNESS (09/14/2015):  Gabriela [name] 46 y.o. female is here because of Her newly diagnosed breast cancer. She is accompanied by her husband to our multidisciplinary clinic today.  She palpated a mass in left breast 2 months ago, no tenderness, skin or nipple change, also noticed intermittent palpitation, no chest paijn , dyspnea or other symptoms, she has good appetite and energy level, no weight loss. She was referred to [facility] him a and underwent a targeted ultrasound of left breast, which showed a 1.6 cm hypoechoic mass within the left breast at the 10 clock position, axillar was negative. She declined mammogram. She underwent ultrasound-guided left breast mass biopsy, which showed invasive ductal carcinoma, ER/PR positive, HER-2 negative.  Her mother died of recurrent metastatic breast cancer. She has decided to proceed with bilateral mastectomy.  GYN HISTORY  Menarchal: 12 LMP: 08/25/2015  Contraceptive: no  HRT: n/a  G2P2: 50 and 32 yo sons   CURRENT THERAPY: adjuvant tamoxifen 20 mg once daily, she started in early September 2017  INTERIM HISTORY: Gabriela [name] returns for follow-up. She was last seen by me about 5 months ago, and she rescheduled her follow-up appointment a few  times. She has gone through a lot of stress lately, she had a multiple episodes of high infection, was seen by ophthalmologist, treated with antibiotics, she believes she still has mild infection. She started her college study last month, which is very demanding and stressful, she also works part-time and night, she feels very stressed out to her own house issue, school, work and her family life. She denies suicidal or depression. She did not start tamoxifen until a month ago, due to her concern of high infection. She has been tolerating  tamoxifen well so far, no significant hot flashes or other noticeable side effects.   MEDICAL HISTORY:  Past Medical History:  Diagnosis Date  . Anemia    "mild" (09/23/2015)  . Breast cancer of upper-inner quadrant of left female breast ([place]) 09/09/2015  . Chronic left shoulder pain   . Depression   . Family history of breast cancer   . Neuromuscular disorder ([place])    nerve pain under left arm    SURGICAL HISTORY: Past Surgical History:  Procedure Laterality Date  . BREAST SURGERY Left 2017  . MASTECTOMY COMPLETE / SIMPLE Right 09/23/2015   PROPHYLACTIC   . MASTECTOMY COMPLETE / SIMPLE W/ SENTINEL NODE BIOPSY Left 09/23/2015   AXILLARY SENTINEL LYMPH NODE BIOPSY  . MASTECTOMY W/ SENTINEL NODE BIOPSY Bilateral 09/23/2015   Procedure: LEFT TOTAL MASTECTOMY WITH LEFT AXILLARY SENTINEL LYMPH NODE BIOPSY, RIGHT PROPHYLACTIC TOTAL MASTECTOMY;  Surgeon: Rolm Bookbinder, MD;  Location: [place];  Service: General;  Laterality: Bilateral;  . RE-EXCISION OF BREAST CANCER,SUPERIOR MARGINS N/A 10/18/2015   Procedure: EXCISION OF SKIN FOR CLOSE MARGIN--ANTERIOR MARGIN;  Surgeon: Rolm Bookbinder, MD;  Location: [place];  Service: General;  Laterality: N/A;  . TONSILLECTOMY    . WISDOM TOOTH EXTRACTION      SOCIAL HISTORY: Social History   Social History  . Marital status: Married    Spouse name: N/A  . Number of children: 2  . Years of education: N/A   Occupational History  . Not on file.   Social History Main Topics  . Smoking status: Former Smoker    Packs/day: 1.00    Years: 10.00    Types: Cigarettes    Quit date: 06/27/1995  . Smokeless tobacco: Never Used  . Alcohol use Yes     Comment: 09/23/2015 was 3 glasses of wine/wk; nothing in the last month"  . Drug use: No  . Sexual activity: Not Currently   Other Topics Concern  . Not on file   Social History Narrative  . 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    FAMILY HISTORY: Family History  Problem Relation Age of Onset  . Cancer Mother   . Breast cancer Mother 75  . Breast cancer Maternal Grandmother 72  . Congestive Heart Failure Maternal Grandmother   . Lung cancer Maternal Grandfather     ALLERGIES:  is allergic to sulfa antibiotics.  MEDICATIONS:  Current Outpatient Prescriptions  Medication Sig Dispense Refill  . Ascorbic Acid (VITAMIN C) 1000 MG tablet Take 1,000 mg by mouth 2 (two) times daily.    Marland Kitchen b complex vitamins tablet Take 1 tablet by mouth daily.    . cholecalciferol (VITAMIN D) 1000 units tablet Take 2,000 Units by mouth daily.    . Omega-3 Fatty Acids (OMEGA 3 PO) Take 2 tablets by mouth daily.    Marland Kitchen  tamoxifen (NOLVADEX) 20 MG tablet Take 1 tablet (20 mg total) by mouth daily. 30 tablet 2  . UNABLE TO FIND Iodine 4 drops daily by  mouth     No current facility-administered medications for this visit.     REVIEW OF SYSTEMS:   Constitutional: Denies fevers, chills or abnormal night sweats Eyes: Denies blurriness of vision, double vision or watery eyes Ears, nose, mouth, throat, and face: Denies mucositis or sore throat Respiratory: Denies cough, dyspnea or wheezes Cardiovascular: Denies palpitation, chest discomfort or lower extremity swelling Gastrointestinal:  Denies nausea, heartburn or change in bowel habits Skin: Denies abnormal skin rashes Lymphatics: Denies new lymphadenopathy or easy bruising Neurological:Denies numbness, tingling or new weaknesses Behavioral/Psych: Mood is stable, no new changes  All other systems were reviewed with the patient and are negative.  PHYSICAL EXAMINATION: ECOG PERFORMANCE STATUS: 0 - Asymptomatic  There were no vitals filed for this visit. There were no vitals filed for this visit.  GENERAL:alert, no distress and comfortable SKIN: skin color, texture, turgor are normal, no rashes or significant lesions EYES: normal,  conjunctiva are pink and non-injected, sclera clear OROPHARYNX:no exudate, no erythema and lips, buccal mucosa, and tongue normal  NECK: supple, thyroid normal size, non-tender, without nodularity LYMPH:  no palpable lymphadenopathy in the cervical, axillary or inguinal LUNGS: clear to auscultation and percussion with normal breathing effort HEART: regular rate & rhythm and no murmurs and no lower extremity edema ABDOMEN:abdomen soft, non-tender and normal bowel sounds Musculoskeletal:no cyanosis of digits and no clubbing  PSYCH: alert & oriented x 3 with fluent speech NEURO: no focal motor/sensory deficits Breasts: pt declined breast exam, she is s/p bilateral mastectomy without reconstruction   LABORATORY DATA:  I have reviewed the data as listed CBC Latest Ref Rng & Units 04/06/2016 11/24/2015 09/20/2015  WBC 3.9 - 10.3 10e3/uL 6.2 5.5 7.8  Hemoglobin 11.6 - 15.9 g/dL 12.6 11.6 11.5(L)  Hematocrit 34.8 - 46.6 % 37.3 34.9 34.6(L)  Platelets 145 - 400 10e3/uL 222 199 242    CMP Latest Ref Rng & Units 04/06/2016 11/24/2015 09/20/2015  Glucose 70 - 140 mg/dl 99 81 88  BUN 7.0 - 26.0 mg/dL 13.3 12.5 9  Creatinine 0.6 - 1.1 mg/dL 0.8 0.8 0.88  Sodium 136 - 145 mEq/L 141 139 138  Potassium 3.5 - 5.1 mEq/L 3.8 3.9 3.7  Chloride 101 - 111 mmol/L - - 105  CO2 22 - 29 mEq/L '23 26 23  ' Calcium 8.4 - 10.4 mg/dL 9.3 9.0 9.2  Total Protein 6.4 - 8.3 g/dL 6.8 6.3(L) -  Total Bilirubin 0.20 - 1.20 mg/dL 0.58 0.50 -  Alkaline Phos 40 - 150 U/L 61 61 -  AST 5 - 34 U/L 17 17 -  ALT 0 - 55 U/L 10 <9 -      PATHOLOGY REPORT  Diagnosis 09/23/2015 1. Breast, simple mastectomy, Right Breast Prophylactic Total Mastectomy, long stitch lateral, short stitch superior - FIBROCYSTIC CHANGES WITH CALCIFICATIONS. - NO EVIDENCE OF MALIGNANCY. 2. Breast, simple mastectomy, Left Breast Mastectomy for Cancer - INVASIVE AND IN SITU DUCTAL CARCINOMA, 1.8 CM. - INVASIVE CARCINOMA FOCALLY INVOLVES ANTERIOR  MARGIN. - INVASIVE AND IN SITU CARCINOMA FOCALLY 0.2 CM FROM POSTERIOR MARGIN. - FIBROCYSTIC CHANGES. - BIOPSY SITE CHANGES. 3. Lymph node, sentinel, biopsy, Left Axillary - ONE BENIGN LYMPH NODE (0/1). 4. Lymph node, sentinel, biopsy, SLN - ONE BENIGN LYMPH NODE (0/1). 5. Lymph node, sentinel, biopsy, SLN - ONE BENIGN LYMPH NODE (0/1). 6. Lymph node, sentinel, biopsy,  SLN - ONE BENIGN LYMPH NODE (0/1). 7. Lymph node, sentinel, biopsy, SLN - ONE BENIGN LYMPH NODE (0/1). 8. Lymph node, sentinel, biopsy, SLN - ONE BENIGN LYMPH NODE (0/1). Microscopic Comment 2. BREAST, INVASIVE TUMOR, WITH LYMPH NODES PRESENT Specimen, including laterality and lymph node sampling (sentinel, non-sentinel): Left breast and five sentinel lymph nodes. Procedure: Mastectomy and sentinel lymph node biopsies. Histologic type: Ductal. Grade: 1 Tubule formation: 2 Nuclear pleomorphism: 2 Mitotic:1 Tumor size (gross measurement): 1.8 cm Margins: Focally involved by invasive carcinoma. Invasive, distance to closest margin: Anterior margin focally involved. Posterior margin is 0.2 cm. In-situ, distance to closest margin: Less than 0.1 cm from anterior margin and 0.2 cm from posterior margin. If margin positive, focally or broadly: Focally. Lymphovascular invasion: No Ductal carcinoma in situ: Present. Grade: Intermediate. Extensive intraductal component: No Lobular neoplasia: No Tumor focality: Unifocal. Treatment effect: No If present, treatment effect in breast tissue, lymph nodes or both: N/A Extent of tumor: Skin: Free of tumor Nipple: N/A Skeletal muscle: N/A Lymph nodes: Examined: 5 Sentinel 0 Non-sentinel 5 Total Lymph nodes with metastasis: 0 Isolated tumor cells (< 0.2 mm): 0 Micrometastasis: (> 0.2 mm and < 2.0 mm): 0 Macrometastasis: (> 2.0 mm): 0 Extracapsular extension: N/A Breast prognostic profile: Case number [id number] Estrogen receptor: 100%, positive, moderate  staining. Progesterone receptor: 100%, positive, strong staining. Her 2 neu: Negative, ratio 1.43. Will be repeated on current specimen. Ki-67: 10% Non-neoplastic breast: Fibrocystic changes. TNM: pT1c, pN0, pMX (JDP:ecj 09/27/2015)  Results: HER2 - NEGATIVE RATIO OF HER2/CEP17 SIGNALS 1.20 AVERAGE HER2 COPY NUMBER PER CELL 1.80 Reference Range: NEGATIVE HER2/CEP17 Ratio <2.0 and average HER2 copy number <4.0 EQUIVOCAL HER2/CEP17 Ratio <2.0 and average HER2 copy number >=4.0 and <6.0 POSITIVE HER2/CEP17 Ratio >=2.0 or <2.0 and average HER2 copy number >=6.0  Oncotype DX RS: 7, low risk, which predicts 10 year distant recurrence risk of 6% with tamoxifen alone.   Diagnosis 10/18/2015 Skin , Left chest skin - FIBROSIS WITH FOCAL INFLAMMATION CONSISTENT WITH SCAR. - NO EVIDENCE OF MALIGNANCY. Microscopic Comment There is skin with underlying fibrosis and patchy chronic inflammation consistent with scar. Immunohistochemistry for cytokeratin AE1/AE3 is performed and does not reveal positivity within the scar. (JDP:ecj 10/19/2015)   RADIOGRAPHIC STUDIES: I have personally reviewed the radiological images as listed and agreed with the findings in the report.  No new scans   ASSESSMENT & PLAN: 46 year old premenopausal woman, family history of breast cancer in her mother, presented with a palpable left breast mass.  1. 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 -She has had early stage breast cancer, low-grade, ER+/PR+/HER2-, her risk of recurrence is low (6% 10 year distant recurrence risk with tamoxifen), she likely will do very well. -she underwent  reexcision for positive anterior margin, and the final surgical pathology was negative for tumor cells.  -Adjuvant chemotherapy was not recommended giving the low risk RS on  Oncotype  -her adjuvant tamoxifen was not started until 5 months after her surgery, per her own choice. She is tolerating well, we'll continue for 10 years. -If she experienced menopause in the next 5 years, I may consider switching her to aromatase inhibitor. -Continue breast cancer surveillance. She does not need screening mammogram due to bilateral mastectomy, I encouraged her to do self exam, and a follow-up with lab and exam routinely. -I encouraged her to eat healthy,  exercise regularly, and to be positive. She has had a lot of stress in her life lately. -I offered her social worker counseling, she declined.  2. Genetics -Due to her young age and family history of breast cancer, I have referred her to Dietitian. She was seen on 5/15.  -Her genetic testing was negative.  The Breast/Ovarian gene panel offered by [facility] includes sequencing and rearrangement analysis for the following 20 genes:  ATM, BARD1, BRCA1, BRCA2, BRIP1, CDH1, CHEK2, EPCAM, FANCC, MLH1, MSH2, MSH6, NBN, PALB2, PMS2, PTEN, RAD51C, RAD51D, TP53, and XRCC2.   Plan -Continue tamoxifen  -I'll see her back in 3 months with lab.   All questions were answered. The patient knows to call the clinic with any problems, questions or concerns.  I spent 25 minutes counseling the patient face to face. The total time spent in the appointment was 30 minutes and more than 50% was on counseling.     Truitt Merle, MD 07/05/2016

## 2016-07-06 ENCOUNTER — Other Ambulatory Visit: Payer: No Typology Code available for payment source

## 2016-07-06 ENCOUNTER — Ambulatory Visit: Payer: No Typology Code available for payment source | Admitting: Hematology

## 2016-07-11 ENCOUNTER — Telehealth: Payer: Self-pay | Admitting: Hematology

## 2016-07-11 NOTE — Telephone Encounter (Signed)
sw pt to confirm 2/9 appt at 945 am per LOS

## 2016-08-02 NOTE — Progress Notes (Signed)
[place]  [phone] [phone]  Clinic Follow up Note   Patient Care Team: Marius Ditch, MD as PCP - General (Internal Medicine) Sylvan Cheese, NP as Nurse Practitioner (Hematology and Oncology) Rolm Bookbinder, MD as Consulting Physician (General Surgery) Thea Silversmith, MD as Consulting Physician (Radiation Oncology) 08/05/2016  CHIEF COMPLAINTS:  Follow up left breast cancer  Oncology History   Breast cancer of upper-inner quadrant of left female breast [facility])   Staging form: Breast, AJCC 7th Edition     Clinical stage from 09/06/2015: Stage IA (T1c, N0, M0) - Signed by Truitt Merle, MD on 09/14/2015     Pathologic stage from 09/23/2015: Stage IA (T1c, N0, cM0) - Signed by Truitt Merle, MD on 10/11/2015        Breast cancer of upper-inner quadrant of left female breast ([place])   08/31/2015 Imaging    Pt declined mammogram, [place] of left breast showed a 1.6 x 1 x 1.1 cm hypoechoic mass within the left breast at the 10:00 position. Axilla nodes appear to be normal.      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      09/06/2015 Receptors her2    ER 100% positive, PR 100% positive, HER-2 negative, Ki-67 10%      09/23/2015 Surgery     bilateral mastectomy and left sentinel lymph node biopsy.      09/23/2015 Pathology Results     Left breast mastectomy showed invasive and in situ carcinoma, 1.8 cm, invasive carcinoma focally involves the anterior margin, 6 lymph nodes were negative.      09/23/2015 Oncotype testing    Oncotype DX RS: 7, low risk, which predicts 10 year distant recurrence risk of 6% with tamoxifen alone.      10/18/2015 Surgery    re-excision for positive margin, final pathology was negative for tumor cells.      02/25/2016 -  Anti-estrogen oral therapy    Tamoxifen 20 mg once daily. She did not start until early Sep per her own choice.        HISTORY OF PRESENTING ILLNESS (09/14/2015):  Gabriela [name] 46 y.o. female is here because of Her newly diagnosed breast cancer. She is accompanied by her husband to our multidisciplinary clinic today.  She palpated a mass in left breast 2 months ago, no tenderness, skin or nipple change, also noticed intermittent palpitation, no chest paijn , dyspnea or other symptoms, she has good appetite and energy level, no weight loss. She was referred to [facility] him a and underwent a targeted ultrasound of left breast, which showed a 1.6 cm hypoechoic mass within the left breast at the 10 clock position, axillar was negative. She declined mammogram. She underwent ultrasound-guided left breast mass biopsy, which showed invasive ductal carcinoma, ER/PR positive, HER-2 negative.  Her mother died of recurrent metastatic breast cancer. She has decided to proceed with bilateral mastectomy.  GYN HISTORY  Menarchal: 12 LMP: 08/25/2015  Contraceptive: no  HRT: n/a  G2P2: 61 and 42 yo sons   CURRENT THERAPY: adjuvant tamoxifen 20 mg once daily, she started in early September 2017, but stopped in late Dec 2017 due to depression   INTERIM HISTORY: Mrs. Rosana Hoes returns for follow-up. The patient states she is busy and stressful  from work, family, and school. She states she stopped Tamoxifen in late December 2017 due to it causing mood swings and "hopeless feelings." She started taking half a tablet sporadically and then worked herself up to a whole dose before stopping. States she has a great appetite. She states her menstrual cycle has started 2 weeks early. She reports left extremity pain. Denies sports or strenuous activity.   MEDICAL HISTORY:  Past Medical History:  Diagnosis Date  . Anemia    "mild" (09/23/2015)  . Breast cancer of upper-inner quadrant  of left female breast ([place]) 09/09/2015  . Chronic left shoulder pain   . Depression   . Family history of breast cancer   . Neuromuscular disorder ([place])    nerve pain under left arm    SURGICAL HISTORY: Past Surgical History:  Procedure Laterality Date  . BREAST SURGERY Left 2017  . MASTECTOMY COMPLETE / SIMPLE Right 09/23/2015   PROPHYLACTIC   . MASTECTOMY COMPLETE / SIMPLE W/ SENTINEL NODE BIOPSY Left 09/23/2015   AXILLARY SENTINEL LYMPH NODE BIOPSY  . MASTECTOMY W/ SENTINEL NODE BIOPSY Bilateral 09/23/2015   Procedure: LEFT TOTAL MASTECTOMY WITH LEFT AXILLARY SENTINEL LYMPH NODE BIOPSY, RIGHT PROPHYLACTIC TOTAL MASTECTOMY;  Surgeon: Rolm Bookbinder, MD;  Location: [place];  Service: General;  Laterality: Bilateral;  . RE-EXCISION OF BREAST CANCER,SUPERIOR MARGINS N/A 10/18/2015   Procedure: EXCISION OF SKIN FOR CLOSE MARGIN--ANTERIOR MARGIN;  Surgeon: Rolm Bookbinder, MD;  Location: [place];  Service: General;  Laterality: N/A;  . TONSILLECTOMY    . WISDOM TOOTH EXTRACTION      SOCIAL HISTORY: Social History   Social History  . Marital status: Married    Spouse name: N/A  . Number of children: 2  . Years of education: N/A   Occupational History  . Not on file.   Social History Main Topics  . Smoking status: Former Smoker    Packs/day: 1.00    Years: 10.00    Types: Cigarettes    Quit date: 06/27/1995  . Smokeless tobacco: Never Used  . Alcohol use Yes     Comment: 09/23/2015 was 3 glasses of wine/wk; nothing in the last month"  . Drug use: No  . Sexual activity: Not Currently   Other Topics Concern  . Not on file   Social History Narrative  . 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    FAMILY HISTORY: Family History  Problem Relation Age of Onset  . Cancer Mother   . Breast cancer Mother 20  . Breast cancer Maternal Grandmother 6  . Congestive Heart  Failure Maternal Grandmother   . Lung cancer Maternal Grandfather     ALLERGIES:  is allergic to sulfa antibiotics.  MEDICATIONS:  Current Outpatient Prescriptions  Medication Sig Dispense Refill  . Ascorbic Acid (VITAMIN C) 1000 MG tablet Take 1,000 mg by mouth 2 (two) times daily.    Marland Kitchen b complex vitamins tablet Take 1 tablet by mouth daily.    . cholecalciferol (VITAMIN D) 1000 units tablet Take 2,000 Units by mouth daily.    . Omega-3 Fatty Acids (OMEGA 3 PO) Take 2 tablets by mouth daily.    . tamoxifen (NOLVADEX) 20 MG tablet Take 1 tablet (20 mg total) by mouth daily. (Patient not taking: Reported on 08/04/2016) 30 tablet 2  . UNABLE TO FIND Iodine 4 drops daily by  mouth    .  venlafaxine XR (EFFEXOR XR) 37.5 MG 24 hr capsule Take 1 capsule (37.5 mg total) by mouth daily with breakfast. 30 capsule 1   No current facility-administered medications for this visit.     REVIEW OF SYSTEMS:   Constitutional: Denies fevers, chills or abnormal night sweats Eyes: Denies blurriness of vision, double vision or watery eyes Ears, nose, mouth, throat, and face: Denies mucositis or sore throat Respiratory: Denies cough, dyspnea or wheezes Cardiovascular: Denies palpitation, chest discomfort or lower extremity swelling Gastrointestinal:  Denies nausea, heartburn or change in bowel habits Skin: Denies abnormal skin rashes Lymphatics: Denies new lymphadenopathy or easy bruising Neurological:Denies numbness, tingling or new weaknesses Behavioral/Psych: Denies depression Musculoskeletal: (+) Soreness of the left upper extremity. All other systems were reviewed with the patient and are negative.  PHYSICAL EXAMINATION: ECOG PERFORMANCE STATUS: 0 - Asymptomatic  Vitals:   08/04/16 1053  BP: (!) 99/49  Pulse: 64  Resp: 17  Temp: 98.8 F (37.1 C)   Filed Weights   08/04/16 1053  Weight: 122 lb 8 oz (55.6 kg)    GENERAL:alert, no distress and comfortable SKIN: skin color, texture, turgor  are normal, no rashes or significant lesions EYES: normal, conjunctiva are pink and non-injected, sclera clear OROPHARYNX:no exudate, no erythema and lips, buccal mucosa, and tongue normal  NECK: supple, thyroid normal size, non-tender, without nodularity LYMPH:  no palpable lymphadenopathy in the cervical, axillary or inguinal LUNGS: clear to auscultation and percussion with normal breathing effort HEART: regular rate & rhythm and no murmurs and no lower extremity edema ABDOMEN:abdomen soft, non-tender and normal bowel sounds Musculoskeletal: (+) Mild tenderness around her left elbow, no edema or skin reaction. PSYCH: alert & oriented x 3 with fluent speech NEURO: no focal motor/sensory deficits Breasts: pt declined breast exam, she is s/p bilateral mastectomy without reconstruction   LABORATORY DATA:  I have reviewed the data as listed CBC Latest Ref Rng & Units 08/04/2016 04/06/2016 11/24/2015  WBC 3.9 - 10.3 10e3/uL 5.7 6.2 5.5  Hemoglobin 11.6 - 15.9 g/dL 12.3 12.6 11.6  Hematocrit 34.8 - 46.6 % 37.9 37.3 34.9  Platelets 145 - 400 10e3/uL 264 222 199    CMP Latest Ref Rng & Units 08/04/2016 04/06/2016 11/24/2015  Glucose 70 - 140 mg/dl 95 99 81  BUN 7.0 - 26.0 mg/dL 12.5 13.3 12.5  Creatinine 0.6 - 1.1 mg/dL 0.8 0.8 0.8  Sodium 136 - 145 mEq/L 140 141 139  Potassium 3.5 - 5.1 mEq/L 4.4 3.8 3.9  Chloride 101 - 111 mmol/L - - -  CO2 22 - 29 mEq/L '26 23 26  ' Calcium 8.4 - 10.4 mg/dL 9.5 9.3 9.0  Total Protein 6.4 - 8.3 g/dL 7.0 6.8 6.3(L)  Total Bilirubin 0.20 - 1.20 mg/dL 0.66 0.58 0.50  Alkaline Phos 40 - 150 U/L 70 61 61  AST 5 - 34 U/L '21 17 17  ' ALT 0 - 55 U/L 12 10 <9      PATHOLOGY REPORT  Diagnosis 09/23/2015 1. Breast, simple mastectomy, Right Breast Prophylactic Total Mastectomy, long stitch lateral, short stitch superior - FIBROCYSTIC CHANGES WITH CALCIFICATIONS. - NO EVIDENCE OF MALIGNANCY. 2. Breast, simple mastectomy, Left Breast Mastectomy for Cancer - INVASIVE  AND IN SITU DUCTAL CARCINOMA, 1.8 CM. - INVASIVE CARCINOMA FOCALLY INVOLVES ANTERIOR MARGIN. - INVASIVE AND IN SITU CARCINOMA FOCALLY 0.2 CM FROM POSTERIOR MARGIN. - FIBROCYSTIC CHANGES. - BIOPSY SITE CHANGES. 3. Lymph node, sentinel, biopsy, Left Axillary - ONE BENIGN LYMPH NODE (0/1). 4. Lymph node, sentinel, biopsy, SLN -  ONE BENIGN LYMPH NODE (0/1). 5. Lymph node, sentinel, biopsy, SLN - ONE BENIGN LYMPH NODE (0/1). 6. Lymph node, sentinel, biopsy, SLN - ONE BENIGN LYMPH NODE (0/1). 7. Lymph node, sentinel, biopsy, SLN - ONE BENIGN LYMPH NODE (0/1). 8. Lymph node, sentinel, biopsy, SLN - ONE BENIGN LYMPH NODE (0/1). Microscopic Comment 2. BREAST, INVASIVE TUMOR, WITH LYMPH NODES PRESENT Specimen, including laterality and lymph node sampling (sentinel, non-sentinel): Left breast and five sentinel lymph nodes. Procedure: Mastectomy and sentinel lymph node biopsies. Histologic type: Ductal. Grade: 1 Tubule formation: 2 Nuclear pleomorphism: 2 Mitotic:1 Tumor size (gross measurement): 1.8 cm Margins: Focally involved by invasive carcinoma. Invasive, distance to closest margin: Anterior margin focally involved. Posterior margin is 0.2 cm. In-situ, distance to closest margin: Less than 0.1 cm from anterior margin and 0.2 cm from posterior margin. If margin positive, focally or broadly: Focally. Lymphovascular invasion: No Ductal carcinoma in situ: Present. Grade: Intermediate. Extensive intraductal component: No Lobular neoplasia: No Tumor focality: Unifocal. Treatment effect: No If present, treatment effect in breast tissue, lymph nodes or both: N/A Extent of tumor: Skin: Free of tumor Nipple: N/A Skeletal muscle: N/A Lymph nodes: Examined: 5 Sentinel 0 Non-sentinel 5 Total Lymph nodes with metastasis: 0 Isolated tumor cells (< 0.2 mm): 0 Micrometastasis: (> 0.2 mm and < 2.0 mm): 0 Macrometastasis: (> 2.0 mm): 0 Extracapsular extension: N/A Breast prognostic  profile: Case number [id number] Estrogen receptor: 100%, positive, moderate staining. Progesterone receptor: 100%, positive, strong staining. Her 2 neu: Negative, ratio 1.43. Will be repeated on current specimen. Ki-67: 10% Non-neoplastic breast: Fibrocystic changes. TNM: pT1c, pN0, pMX (JDP:ecj 09/27/2015)  Results: HER2 - NEGATIVE RATIO OF HER2/CEP17 SIGNALS 1.20 AVERAGE HER2 COPY NUMBER PER CELL 1.80 Reference Range: NEGATIVE HER2/CEP17 Ratio <2.0 and average HER2 copy number <4.0 EQUIVOCAL HER2/CEP17 Ratio <2.0 and average HER2 copy number >=4.0 and <6.0 POSITIVE HER2/CEP17 Ratio >=2.0 or <2.0 and average HER2 copy number >=6.0  Oncotype DX RS: 7, low risk, which predicts 10 year distant recurrence risk of 6% with tamoxifen alone.   Diagnosis 10/18/2015 Skin , Left chest skin - FIBROSIS WITH FOCAL INFLAMMATION CONSISTENT WITH SCAR. - NO EVIDENCE OF MALIGNANCY. Microscopic Comment There is skin with underlying fibrosis and patchy chronic inflammation consistent with scar. Immunohistochemistry for cytokeratin AE1/AE3 is performed and does not reveal positivity within the scar. (JDP:ecj 10/19/2015)   RADIOGRAPHIC STUDIES: I have personally reviewed the radiological images as listed and agreed with the findings in the report.  No results found.  ASSESSMENT & PLAN: 46 y.o. premenopausal woman, family history of breast cancer in her mother, presented with a palpable left breast mass.  1. 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 -She has had early stage breast cancer, low-grade, ER+/PR+/HER2-, her risk of recurrence is low (6% 10 year distant recurrence risk with tamoxifen), she likely will do very well. -she underwent  reexcision for positive anterior margin, and the final surgical pathology was negative for tumor  cells.  -Adjuvant chemotherapy was not recommended giving the low risk RS on Oncotype  -her adjuvant tamoxifen was not started until 5 months after her surgery, per her own choice. I recommend her to continue for 10 years. If she experienced menopause in the next 5 years, I may consider switching her to aromatase inhibitor. -The patient took herself off of Tamoxifen a few months ago due to mood  swings and a feeling of hopelessness. I will prescribe Effexor XR for depression. I advised the patient to take this and then restart Tamoxifen in 3 weeks. She agrees to try.  -Continue breast cancer surveillance. She does not need screening mammogram due to bilateral mastectomy, I encouraged her to do self exam, and a follow-up with lab and exam routinely. -I encouraged her to eat healthy, exercise regularly, and to be positive. She has had a lot of stress in her life lately. -I previously offered her social worker counseling and she declined.  2. Genetics -Due to her young age and family history of breast cancer, I have referred her to Dietitian. She was seen on 11/08/15.  -Her genetic testing was negative.  The Breast/Ovarian gene panel offered by [facility] includes sequencing and rearrangement analysis for the following 20 genes:  ATM, BARD1, BRCA1, BRCA2, BRIP1, CDH1, CHEK2, EPCAM, FANCC, MLH1, MSH2, MSH6, NBN, PALB2, PMS2, PTEN, RAD51C, RAD51D, TP53, and XRCC2.  3. Tamoxifen Induced Depression -The patient has stress from family, work, and school. -The patient reports mood swings and depression while taking Tamoxifen and took herself off. -I previously offered social work, but she declined. -Encouraged healthy eating and to be positive. -I prescribed Effexor XR 37.5 mg. She is to take it for 3 weeks to see if she tolerates it and then resume Tamoxifen.  Plan -Prescribed Effexor 37.5 mg and then start Tamoxifen 3 weeks afterwards. -I'll see her back in 3 months with lab.  All questions were  answered. The patient knows to call the clinic with any problems, questions or concerns.  I spent 25 minutes counseling the patient face to face. The total time spent in the appointment was 30 minutes and more than 50% was on counseling.     Truitt Merle, MD 08/05/2016   This document serves as a record of services personally performed by Truitt Merle, MD. It was created on her behalf by Darcus Austin, a trained medical scribe. The creation of this record is based on the scribe's personal observations and the provider's statements to them. This document has been checked and approved by the attending provider.

## 2016-08-04 ENCOUNTER — Other Ambulatory Visit (HOSPITAL_BASED_OUTPATIENT_CLINIC_OR_DEPARTMENT_OTHER): Payer: No Typology Code available for payment source

## 2016-08-04 ENCOUNTER — Ambulatory Visit (HOSPITAL_BASED_OUTPATIENT_CLINIC_OR_DEPARTMENT_OTHER): Payer: No Typology Code available for payment source | Admitting: Hematology

## 2016-08-04 VITALS — BP 99/49 | HR 64 | Temp 98.8°F | Resp 17 | Ht 68.0 in | Wt 122.5 lb

## 2016-08-04 DIAGNOSIS — Z803 Family history of malignant neoplasm of breast: Secondary | ICD-10-CM

## 2016-08-04 DIAGNOSIS — C50212 Malignant neoplasm of upper-inner quadrant of left female breast: Secondary | ICD-10-CM

## 2016-08-04 DIAGNOSIS — Z170 Estrogen receptor positive status [ER+]: Secondary | ICD-10-CM

## 2016-08-04 DIAGNOSIS — F329 Major depressive disorder, single episode, unspecified: Secondary | ICD-10-CM | POA: Diagnosis not present

## 2016-08-04 LAB — CBC WITH DIFFERENTIAL/PLATELET
BASO%: 1.1 % (ref 0.0–2.0)
BASOS ABS: 0.1 10*3/uL (ref 0.0–0.1)
EOS%: 1.4 % (ref 0.0–7.0)
Eosinophils Absolute: 0.1 10*3/uL (ref 0.0–0.5)
HEMATOCRIT: 37.9 % (ref 34.8–46.6)
HEMOGLOBIN: 12.3 g/dL (ref 11.6–15.9)
LYMPH#: 2 10*3/uL (ref 0.9–3.3)
LYMPH%: 34.3 % (ref 14.0–49.7)
MCH: 29.4 pg (ref 25.1–34.0)
MCHC: 32.5 g/dL (ref 31.5–36.0)
MCV: 90.5 fL (ref 79.5–101.0)
MONO#: 0.6 10*3/uL (ref 0.1–0.9)
MONO%: 10.5 % (ref 0.0–14.0)
NEUT#: 3 10*3/uL (ref 1.5–6.5)
NEUT%: 52.7 % (ref 38.4–76.8)
PLATELETS: 264 10*3/uL (ref 145–400)
RBC: 4.19 10*6/uL (ref 3.70–5.45)
RDW: 14 % (ref 11.2–14.5)
WBC: 5.7 10*3/uL (ref 3.9–10.3)

## 2016-08-04 LAB — COMPREHENSIVE METABOLIC PANEL
ALBUMIN: 4.1 g/dL (ref 3.5–5.0)
ALK PHOS: 70 U/L (ref 40–150)
ALT: 12 U/L (ref 0–55)
ANION GAP: 7 meq/L (ref 3–11)
AST: 21 U/L (ref 5–34)
BILIRUBIN TOTAL: 0.66 mg/dL (ref 0.20–1.20)
BUN: 12.5 mg/dL (ref 7.0–26.0)
CALCIUM: 9.5 mg/dL (ref 8.4–10.4)
CHLORIDE: 107 meq/L (ref 98–109)
CO2: 26 mEq/L (ref 22–29)
CREATININE: 0.8 mg/dL (ref 0.6–1.1)
EGFR: >90 mL/min/{1.73_m2} (ref >90)
Glucose: 95 mg/dl (ref 70–140)
Potassium: 4.4 mEq/L (ref 3.5–5.1)
Sodium: 140 mEq/L (ref 136–145)
Total Protein: 7 g/dL (ref 6.4–8.3)

## 2016-08-04 MED ORDER — VENLAFAXINE HCL ER 37.5 MG PO CP24: 37.5000 mg | ORAL_CAPSULE | Freq: Every day | ORAL | 1 refills | Status: DC

## 2016-08-05 ENCOUNTER — Encounter: Payer: Self-pay | Admitting: Hematology

## 2016-08-10 ENCOUNTER — Telehealth: Payer: Self-pay | Admitting: Hematology

## 2016-08-10 NOTE — Telephone Encounter (Signed)
S/w pt, gave appt for 5/3 @ 9.45am.

## 2016-08-15 ENCOUNTER — Ambulatory Visit: Payer: No Typology Code available for payment source | Admitting: Hematology

## 2016-08-15 ENCOUNTER — Other Ambulatory Visit: Payer: No Typology Code available for payment source

## 2016-10-25 NOTE — Progress Notes (Signed)
[place]  [phone] [phone]  Clinic Follow up Note   Patient Care Team: Marius Ditch, MD as PCP - General (Internal Medicine) Sylvan Cheese, NP as Nurse Practitioner (Hematology and Oncology) Rolm Bookbinder, MD as Consulting Physician (General Surgery) Thea Silversmith, MD (Inactive) as Consulting Physician (Radiation Oncology) 10/26/2016  CHIEF COMPLAINTS:  Follow up left breast cancer  Oncology History   Breast cancer of upper-inner quadrant of left female breast [facility])   Staging form: Breast, AJCC 7th Edition     Clinical stage from 09/06/2015: Stage IA (T1c, N0, M0) - Signed by Truitt Merle, MD on 09/14/2015     Pathologic stage from 09/23/2015: Stage IA (T1c, N0, cM0) - Signed by Truitt Merle, MD on 10/11/2015        Breast cancer of upper-inner quadrant of left female breast ([place])   08/31/2015 Imaging    Pt declined mammogram, [place] of left breast showed a 1.6 x 1 x 1.1 cm hypoechoic mass within the left breast at the 10:00 position. Axilla nodes appear to be normal.      09/06/2015 Initial Diagnosis    Breast cancer of upper-inner quadrant of left female breast ([place])      09/06/2015 Initial Biopsy    Ultrasound-guided left breast mass biopsy showed G1-2 invasive ductal carcinoma, DCIS with associated calcifications.      09/06/2015 Receptors her2    ER 100% positive, PR 100% positive, HER-2 negative, Ki-67 10%      09/23/2015 Surgery     bilateral mastectomy and left sentinel lymph node biopsy.      09/23/2015 Pathology Results     Left breast mastectomy showed invasive and in situ carcinoma, 1.8 cm, invasive carcinoma focally involves the anterior margin, 6 lymph nodes were negative.      09/23/2015 Oncotype testing    Oncotype DX RS: 7, low risk, which predicts 10 year distant recurrence risk of 6% with tamoxifen alone.      10/18/2015 Surgery    re-excision for positive margin, final pathology was negative for tumor  cells.      02/25/2016 - 06/25/2016 Anti-estrogen oral therapy    Tamoxifen 20 mg once daily. She did not start until early Sep per her own choice. Patient stopped in late December due to depression.       HISTORY OF PRESENTING ILLNESS (09/14/2015):  Gabriela [name] 46 y.o. female is here because of Her newly diagnosed breast cancer. She is accompanied by her husband to our multidisciplinary clinic today.  She palpated a mass in left breast 2 months ago, no tenderness, skin or nipple change, also noticed intermittent palpitation, no chest paijn , dyspnea or other symptoms, she has good appetite and energy level, no weight loss. She was referred to [facility] him a and underwent a targeted ultrasound of left breast, which showed a 1.6 cm hypoechoic mass within the left breast at the 10 clock position, axillar was negative. She declined mammogram. She underwent ultrasound-guided left breast mass biopsy, which showed invasive ductal carcinoma, ER/PR positive, HER-2 negative.  Her mother died of recurrent metastatic breast cancer. She has decided to proceed with bilateral mastectomy.  GYN HISTORY  Menarchal: 12 LMP: 08/25/2015  Contraceptive: no  HRT: n/a  G2P2: 37 and 60 yo sons   CURRENT THERAPY: adjuvant tamoxifen 20 mg once daily, she started in early September 2017, but stopped in late Dec 2017 due to depression.   INTERIM HISTORY: Gabriela [name] returns for  follow-up. The patient complains of pain across her chest and left shoulder and down her left arm, which has gotten worse recently. She reports this to be ongoing burning and sharp pains. The patient did not participate in physical therapy after her surgery as she felt her prognosis was good. She reports "I was fine for a long time," and that the pain did not start until some time after her surgery. She is concerned her discomfort may be related to cancer recurrence.    MEDICAL HISTORY:  Past Medical History:    Diagnosis Date  . Anemia    "mild" (09/23/2015)  . Breast cancer of upper-inner quadrant of left female breast ([place]) 09/09/2015  . Chronic left shoulder pain   . Depression   . Family history of breast cancer   . Neuromuscular disorder ([place])    nerve pain under left arm    SURGICAL HISTORY: Past Surgical History:  Procedure Laterality Date  . BREAST SURGERY Left 2017  . MASTECTOMY COMPLETE / SIMPLE Right 09/23/2015   PROPHYLACTIC   . MASTECTOMY COMPLETE / SIMPLE W/ SENTINEL NODE BIOPSY Left 09/23/2015   AXILLARY SENTINEL LYMPH NODE BIOPSY  . MASTECTOMY W/ SENTINEL NODE BIOPSY Bilateral 09/23/2015   Procedure: LEFT TOTAL MASTECTOMY WITH LEFT AXILLARY SENTINEL LYMPH NODE BIOPSY, RIGHT PROPHYLACTIC TOTAL MASTECTOMY;  Surgeon: Rolm Bookbinder, MD;  Location: [place];  Service: General;  Laterality: Bilateral;  . RE-EXCISION OF BREAST CANCER,SUPERIOR MARGINS N/A 10/18/2015   Procedure: EXCISION OF SKIN FOR CLOSE MARGIN--ANTERIOR MARGIN;  Surgeon: Rolm Bookbinder, MD;  Location: [place];  Service: General;  Laterality: N/A;  . TONSILLECTOMY    . WISDOM TOOTH EXTRACTION      SOCIAL HISTORY: Social History   Social History  . Marital status: Married    Spouse name: N/A  . Number of children: 2  . Years of education: N/A   Occupational History  . Not on file.   Social History Main Topics  . Smoking status: Former Smoker    Packs/day: 1.00    Years: 10.00    Types: Cigarettes    Quit date: 06/27/1995  . Smokeless tobacco: Never Used  . Alcohol use Yes     Comment: 09/23/2015 was 3 glasses of wine/wk; nothing in the last month"  . Drug use: No  . Sexual activity: Not Currently   Other Topics Concern  . Not on file   Social History Narrative  . 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    FAMILY HISTORY: Family History  Problem Relation Age of Onset  . Cancer Mother   .  Breast cancer Mother 37  . Breast cancer Maternal Grandmother 40  . Congestive Heart Failure Maternal Grandmother   . Lung cancer Maternal Grandfather     ALLERGIES:  is allergic to sulfa antibiotics.  MEDICATIONS:  Current Outpatient Prescriptions  Medication Sig Dispense Refill  . Ascorbic Acid (VITAMIN C) 1000 MG tablet Take 1,000 mg by mouth 2 (two) times daily.    Marland Kitchen b complex vitamins tablet Take 1 tablet by mouth daily.    . cholecalciferol (VITAMIN D) 1000 units tablet Take 2,000 Units by mouth daily.    . Omega-3 Fatty Acids (OMEGA 3 PO) Take 2 tablets by mouth daily.    Marland Kitchen UNABLE TO FIND Iodine 4 drops daily by  mouth    . tamoxifen (NOLVADEX) 20 MG tablet Take  1 tablet (20 mg total) by mouth daily. (Patient not taking: Reported on 08/04/2016) 30 tablet 2  . venlafaxine XR (EFFEXOR XR) 37.5 MG 24 hr capsule Take 1 capsule (37.5 mg total) by mouth daily with breakfast. (Patient not taking: Reported on 10/26/2016) 30 capsule 1   No current facility-administered medications for this visit.     REVIEW OF SYSTEMS:  Constitutional: Denies fevers, chills or abnormal night sweats (+) pain to left side Eyes: Denies blurriness of vision, double vision or watery eyes Ears, nose, mouth, throat, and face: Denies mucositis or sore throat Respiratory: Denies cough, dyspnea or wheezes Cardiovascular: Denies palpitation, chest discomfort or lower extremity swelling Gastrointestinal:  Denies nausea, heartburn or change in bowel habits Skin: Denies abnormal skin rashes Lymphatics: Denies new lymphadenopathy or easy bruising Neurological:Denies numbness, tingling or new weaknesses Behavioral/Psych: Denies depression Musculoskeletal: (+) Soreness and pain of the left upper extremity. All other systems were reviewed with the patient and are negative.  PHYSICAL EXAMINATION: ECOG PERFORMANCE STATUS: 0 - Asymptomatic  Vitals:   10/26/16 1048  BP: (!) 101/58  Pulse: (!) 55  Temp: 98.6 F (37 C)    Filed Weights   10/26/16 1048  Weight: 124 lb (56.2 kg)    GENERAL:alert, no distress and comfortable SKIN: skin color, texture, turgor are normal, no rashes or significant lesions EYES: normal, conjunctiva are pink and non-injected, sclera clear OROPHARYNX:no exudate, no erythema and lips, buccal mucosa, and tongue normal  NECK: supple, thyroid normal size, non-tender, without nodularity LYMPH:  no palpable lymphadenopathy in the cervical, axillary or inguinal LUNGS: clear to auscultation and percussion with normal breathing effort HEART: regular rate & rhythm and no murmurs and no lower extremity edema ABDOMEN:abdomen soft, non-tender and normal bowel sounds Musculoskeletal: (+) Mild tenderness around her left elbow, no edema or skin reaction. PSYCH: alert & oriented x 3 with fluent speech NEURO: no focal motor/sensory deficits Breasts: pt declined breast exam today, she is s/p bilateral mastectomy without reconstruction.   LABORATORY DATA:  I have reviewed the data as listed CBC Latest Ref Rng & Units 10/26/2016 08/04/2016 04/06/2016  WBC 3.9 - 10.3 10e3/uL 5.5 5.7 6.2  Hemoglobin 11.6 - 15.9 g/dL 12.5 12.3 12.6  Hematocrit 34.8 - 46.6 % 37.4 37.9 37.3  Platelets 145 - 400 10e3/uL 252 264 222    CMP Latest Ref Rng & Units 10/26/2016 08/04/2016 04/06/2016  Glucose 70 - 140 mg/dl 83 95 99  BUN 7.0 - 26.0 mg/dL 16.9 12.5 13.3  Creatinine 0.6 - 1.1 mg/dL 0.8 0.8 0.8  Sodium 136 - 145 mEq/L 139 140 141  Potassium 3.5 - 5.1 mEq/L 4.2 4.4 3.8  Chloride 101 - 111 mmol/L - - -  CO2 22 - 29 mEq/L _0 Calcium 8.4 - 10.4 mg/dL 9.3 9.5 9.3  Total Protein 6.4 - 8.3 g/dL 6.9 7.0 6.8  Total Bilirubin 0.20 - 1.20 mg/dL 0.40 0.66 0.58  Alkaline Phos 40 - 150 U/L 67 70 61  AST 5 - 34 U/L _1 ALT 0 - 55 U/L _2 PATHOLOGY REPORT  Diagnosis 09/23/2015 1. Breast, simple mastectomy, Right Breast Prophylactic Total Mastectomy, long stitch lateral, short stitch superior -  FIBROCYSTIC CHANGES WITH CALCIFICATIONS. - NO EVIDENCE OF MALIGNANCY. 2. Breast, simple mastectomy, Left Breast Mastectomy for Cancer - INVASIVE AND IN SITU DUCTAL CARCINOMA, 1.8 CM. - INVASIVE CARCINOMA FOCALLY INVOLVES ANTERIOR MARGIN. - INVASIVE AND IN SITU CARCINOMA FOCALLY 0.2 CM FROM POSTERIOR MARGIN. -  FIBROCYSTIC CHANGES. - BIOPSY SITE CHANGES. 3. Lymph node, sentinel, biopsy, Left Axillary - ONE BENIGN LYMPH NODE (0/1). 4. Lymph node, sentinel, biopsy, SLN - ONE BENIGN LYMPH NODE (0/1). 5. Lymph node, sentinel, biopsy, SLN - ONE BENIGN LYMPH NODE (0/1). 6. Lymph node, sentinel, biopsy, SLN - ONE BENIGN LYMPH NODE (0/1). 7. Lymph node, sentinel, biopsy, SLN - ONE BENIGN LYMPH NODE (0/1). 8. Lymph node, sentinel, biopsy, SLN - ONE BENIGN LYMPH NODE (0/1). Microscopic Comment 2. BREAST, INVASIVE TUMOR, WITH LYMPH NODES PRESENT Specimen, including laterality and lymph node sampling (sentinel, non-sentinel): Left breast and five sentinel lymph nodes. Procedure: Mastectomy and sentinel lymph node biopsies. Histologic type: Ductal. Grade: 1 Tubule formation: 2 Nuclear pleomorphism: 2 Mitotic:1 Tumor size (gross measurement): 1.8 cm Margins: Focally involved by invasive carcinoma. Invasive, distance to closest margin: Anterior margin focally involved. Posterior margin is 0.2 cm. In-situ, distance to closest margin: Less than 0.1 cm from anterior margin and 0.2 cm from posterior margin. If margin positive, focally or broadly: Focally. Lymphovascular invasion: No Ductal carcinoma in situ: Present. Grade: Intermediate. Extensive intraductal component: No Lobular neoplasia: No Tumor focality: Unifocal. Treatment effect: No If present, treatment effect in breast tissue, lymph nodes or both: N/A Extent of tumor: Skin: Free of tumor Nipple: N/A Skeletal muscle: N/A Lymph nodes: Examined: 5 Sentinel 0 Non-sentinel 5 Total Lymph nodes with metastasis: 0 Isolated tumor  cells (< 0.2 mm): 0 Micrometastasis: (> 0.2 mm and < 2.0 mm): 0 Macrometastasis: (> 2.0 mm): 0 Extracapsular extension: N/A Breast prognostic profile: Case number [id number] Estrogen receptor: 100%, positive, moderate staining. Progesterone receptor: 100%, positive, strong staining. Her 2 neu: Negative, ratio 1.43. Will be repeated on current specimen. Ki-67: 10% Non-neoplastic breast: Fibrocystic changes. TNM: pT1c, pN0, pMX (JDP:ecj 09/27/2015)  Results: HER2 - NEGATIVE RATIO OF HER2/CEP17 SIGNALS 1.20 AVERAGE HER2 COPY NUMBER PER CELL 1.80 Reference Range: NEGATIVE HER2/CEP17 Ratio <2.0 and average HER2 copy number <4.0 EQUIVOCAL HER2/CEP17 Ratio <2.0 and average HER2 copy number >=4.0 and <6.0 POSITIVE HER2/CEP17 Ratio >=2.0 or <2.0 and average HER2 copy number >=6.0  Oncotype DX RS: 7, low risk, which predicts 10 year distant recurrence risk of 6% with tamoxifen alone.   Diagnosis 10/18/2015 Skin , Left chest skin - FIBROSIS WITH FOCAL INFLAMMATION CONSISTENT WITH SCAR. - NO EVIDENCE OF MALIGNANCY. Microscopic Comment There is skin with underlying fibrosis and patchy chronic inflammation consistent with scar. Immunohistochemistry for cytokeratin AE1/AE3 is performed and does not reveal positivity within the scar. (JDP:ecj 10/19/2015)   RADIOGRAPHIC STUDIES: I have personally reviewed the radiological images as listed and agreed with the findings in the report.  No results found.  ASSESSMENT & PLAN: 46 y.o. premenopausal woman, family history of breast cancer in her mother, presented with a palpable left breast mass.  1. 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 -She has had early stage breast cancer, low-grade, ER+/PR+/HER2-, her risk of recurrence is low (6% 10 year distant recurrence risk with  tamoxifen), she likely will do very well. -she underwent  reexcision for positive anterior margin, and the final surgical pathology was negative for tumor cells.  -Adjuvant chemotherapy was not recommended giving the low risk RS on Oncotype  -her adjuvant tamoxifen was not started until 5 months after her surgery, per her own choice. I recommend her to continue for 10 years. If she experienced menopause  in the next 5 years, I may consider switching her to aromatase inhibitor. -The patient took herself off of Tamoxifen previously due to mood swings and a feeling of hopelessness. I prescribed Effexor XR for depression. She tried for a few week, but stopped Effexor due to mild side effects. -The patient is concerned her left sided shoulder and arm pain is related to disease recurrence. I think it is unlikley, although not ruled out. I recommend a bone scan, she declined. The patient declined breast exam today. -she is undecided if she will retry Effexor, then start tamoxifen afterwards. - I discussed aromatase inhibitors with the patient today as an alternative to Tamoxifen. She is not interested in this option.  2. Genetics -Due to her young age and family history of breast cancer, I have referred her to Dietitian. She was seen on 11/08/15.  -Her genetic testing was negative.  The Breast/Ovarian gene panel offered by [facility] includes sequencing and rearrangement analysis for the following 20 genes:  ATM, BARD1, BRCA1, BRCA2, BRIP1, CDH1, CHEK2, EPCAM, FANCC, MLH1, MSH2, MSH6, NBN, PALB2, PMS2, PTEN, RAD51C, RAD51D, TP53, and XRCC2.  3. Tamoxifen Induced Depression -The patient has stress from family, work, and school. -The patient reports mood swings and depression while taking Tamoxifen and took herself off. -I previously offered social work, but she declined. -Encouraged healthy eating and to be positive. -I previously prescribed Effexor XR 37.5 mg but she stopped after a few weeks due to  mild side effects. She is undecided if she will retry again to if she tolerates it and then resume Tamoxifen. - The patient stopped taking Tamoxifen in late December 2017 due to ongoing depression. - The patient does not want to try a different antidepressant.  4. Left shoulder and arm pain - The patient reports ongoing burning and sharp pain to her left shoulder that radiates down her left arm. She is concerned this is a sign of recurrent disease. - She has not participated in PT. I discussed this with her today and she is interested, but would like to find a physical therapist provider on her own. Declined a PT referral at this time. I strongly encouraged her to do PT.  - The patient may also see an orthopedic surgeon for evaluation of her pain. - I encouraged the patient that I do not believe this pain is a sign of recurrence, though we may elect to get a bone scan to rule out bone metastasis. She is not interested in bone scan at this point.  Plan - Available labs reviewed.  - The patient will look for her own physical therapist for left arm/shoulder area pain  - Follow up again in 3 months  -She'll think about if she will retry Effexor, and then restart tamoxifen. -she declined breast exam and bone scan.  All questions were answered. The patient knows to call the clinic with any problems, questions or concerns.  I spent 25 minutes counseling the patient face to face. The total time spent in the appointment was 30 minutes and more than 50% was on counseling.  This document serves as a record of services personally performed by Truitt Merle, MD. It was created on her behalf by Maryla Morrow, a trained medical scribe. The creation of this record is based on the scribe's personal observations and the provider's statements to them. This document has been checked and approved by the attending provider.    Truitt Merle, MD 10/26/2016

## 2016-10-26 ENCOUNTER — Other Ambulatory Visit (HOSPITAL_BASED_OUTPATIENT_CLINIC_OR_DEPARTMENT_OTHER): Payer: No Typology Code available for payment source

## 2016-10-26 ENCOUNTER — Telehealth: Payer: Self-pay | Admitting: Hematology

## 2016-10-26 ENCOUNTER — Ambulatory Visit (HOSPITAL_BASED_OUTPATIENT_CLINIC_OR_DEPARTMENT_OTHER): Payer: No Typology Code available for payment source | Admitting: Hematology

## 2016-10-26 VITALS — BP 101/58 | HR 55 | Temp 98.6°F | Ht 68.0 in | Wt 124.0 lb

## 2016-10-26 DIAGNOSIS — M79602 Pain in left arm: Secondary | ICD-10-CM | POA: Diagnosis not present

## 2016-10-26 DIAGNOSIS — Z853 Personal history of malignant neoplasm of breast: Secondary | ICD-10-CM

## 2016-10-26 DIAGNOSIS — M25512 Pain in left shoulder: Secondary | ICD-10-CM | POA: Diagnosis not present

## 2016-10-26 DIAGNOSIS — C50212 Malignant neoplasm of upper-inner quadrant of left female breast: Secondary | ICD-10-CM

## 2016-10-26 DIAGNOSIS — Z170 Estrogen receptor positive status [ER+]: Principal | ICD-10-CM

## 2016-10-26 LAB — COMPREHENSIVE METABOLIC PANEL
ALBUMIN: 4.2 g/dL (ref 3.5–5.0)
ALK PHOS: 67 U/L (ref 40–150)
ALT: 10 U/L (ref 0–55)
ANION GAP: 8 meq/L (ref 3–11)
AST: 16 U/L (ref 5–34)
BILIRUBIN TOTAL: 0.4 mg/dL (ref 0.20–1.20)
BUN: 16.9 mg/dL (ref 7.0–26.0)
CALCIUM: 9.3 mg/dL (ref 8.4–10.4)
CO2: 25 mEq/L (ref 22–29)
Chloride: 107 mEq/L (ref 98–109)
Creatinine: 0.8 mg/dL (ref 0.6–1.1)
EGFR: 84 mL/min/{1.73_m2} — ABNORMAL LOW (ref >90)
Glucose: 83 mg/dl (ref 70–140)
POTASSIUM: 4.2 meq/L (ref 3.5–5.1)
Sodium: 139 mEq/L (ref 136–145)
TOTAL PROTEIN: 6.9 g/dL (ref 6.4–8.3)

## 2016-10-26 LAB — CBC WITH DIFFERENTIAL/PLATELET
BASO%: 1.1 % (ref 0.0–2.0)
BASOS ABS: 0.1 10*3/uL (ref 0.0–0.1)
EOS ABS: 0.1 10*3/uL (ref 0.0–0.5)
EOS%: 1.5 % (ref 0.0–7.0)
HCT: 37.4 % (ref 34.8–46.6)
HEMOGLOBIN: 12.5 g/dL (ref 11.6–15.9)
LYMPH#: 1.7 10*3/uL (ref 0.9–3.3)
LYMPH%: 31.5 % (ref 14.0–49.7)
MCH: 30 pg (ref 25.1–34.0)
MCHC: 33.4 g/dL (ref 31.5–36.0)
MCV: 89.9 fL (ref 79.5–101.0)
MONO#: 0.5 10*3/uL (ref 0.1–0.9)
MONO%: 8.4 % (ref 0.0–14.0)
NEUT#: 3.1 10*3/uL (ref 1.5–6.5)
NEUT%: 57.5 % (ref 38.4–76.8)
PLATELETS: 252 10*3/uL (ref 145–400)
RBC: 4.16 10*6/uL (ref 3.70–5.45)
RDW: 13.1 % (ref 11.2–14.5)
WBC: 5.5 10*3/uL (ref 3.9–10.3)

## 2016-10-26 NOTE — Telephone Encounter (Signed)
Appointments scheduled per 10/26/16 los.  °Patient was given a copy of the AVS report and appointment schedule per 10/26/16 los. °

## 2016-10-28 ENCOUNTER — Encounter: Payer: Self-pay | Admitting: Hematology

## 2016-10-30 ENCOUNTER — Telehealth: Payer: Self-pay

## 2016-10-30 NOTE — Telephone Encounter (Signed)
Pt called for labs drawn last visit. They were not resulted when she was here. Gave results "everything normal".

## 2017-01-26 ENCOUNTER — Ambulatory Visit: Payer: No Typology Code available for payment source | Admitting: Hematology

## 2017-01-26 ENCOUNTER — Other Ambulatory Visit: Payer: No Typology Code available for payment source

## 2017-02-01 ENCOUNTER — Telehealth: Payer: Self-pay | Admitting: Hematology

## 2017-02-01 NOTE — Telephone Encounter (Signed)
Scheduled appt per 8/3 sch message - patient is aware pf appt date and time.

## 2017-02-13 IMAGING — MG MM DIAG BREAST TOMO UNI RIGHT
8 of 9 series · 8 of 17 positions shown · non-contrast
Comparison: None.

CLINICAL DATA: Recently diagnosed left breast invasive ductal
carcinoma. The patient refused a mammogram at the time of diagnosis.
Currently, she is refusing a left diagnostic mammogram. The patient
states that she is currently planning on a bilateral mastectomy.

EXAM:
2D DIGITAL DIAGNOSTIC UNILATERAL RIGHT MAMMOGRAM WITH CAD AND
ADJUNCT TOMO

[R ML (1 of 2)]
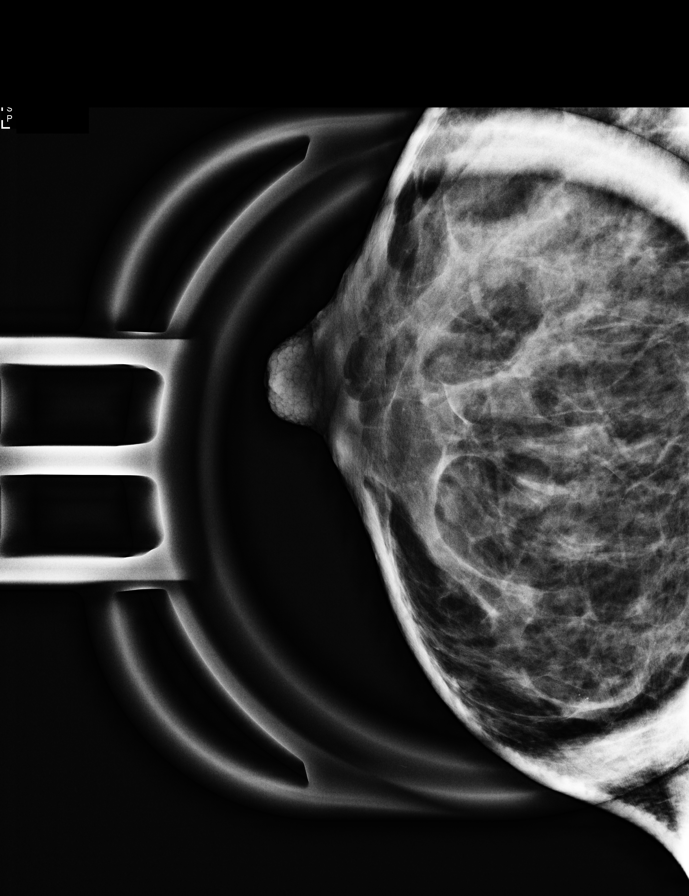

[R ML (2 of 2)]
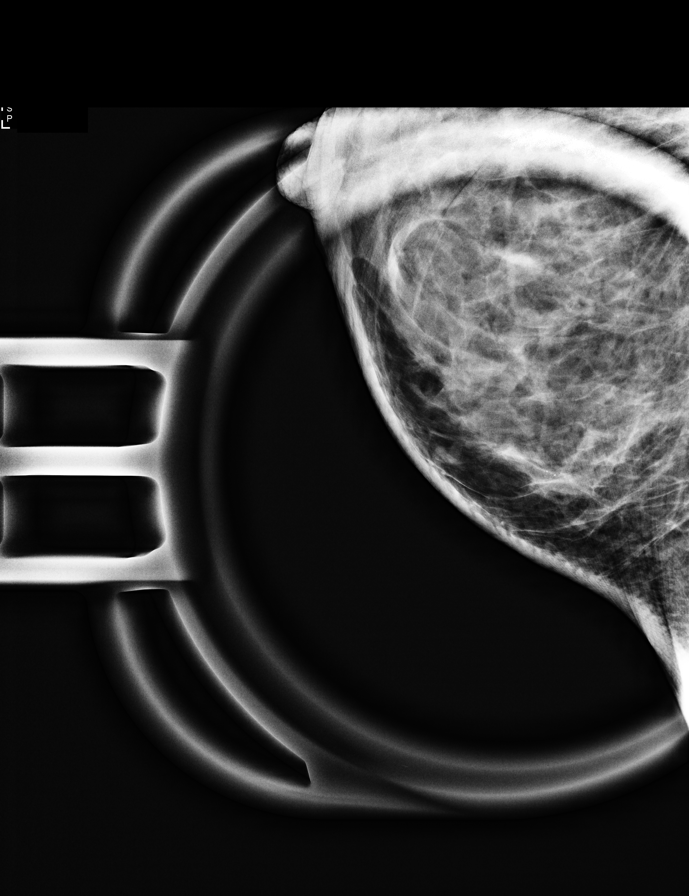

[R CC (1 of 2)]
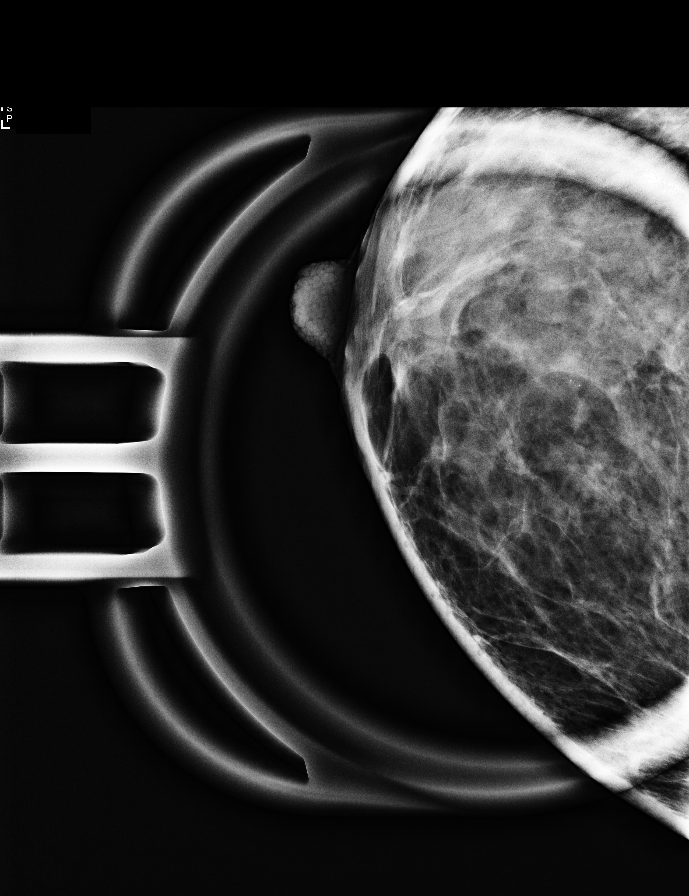

[R CC synth-2D]
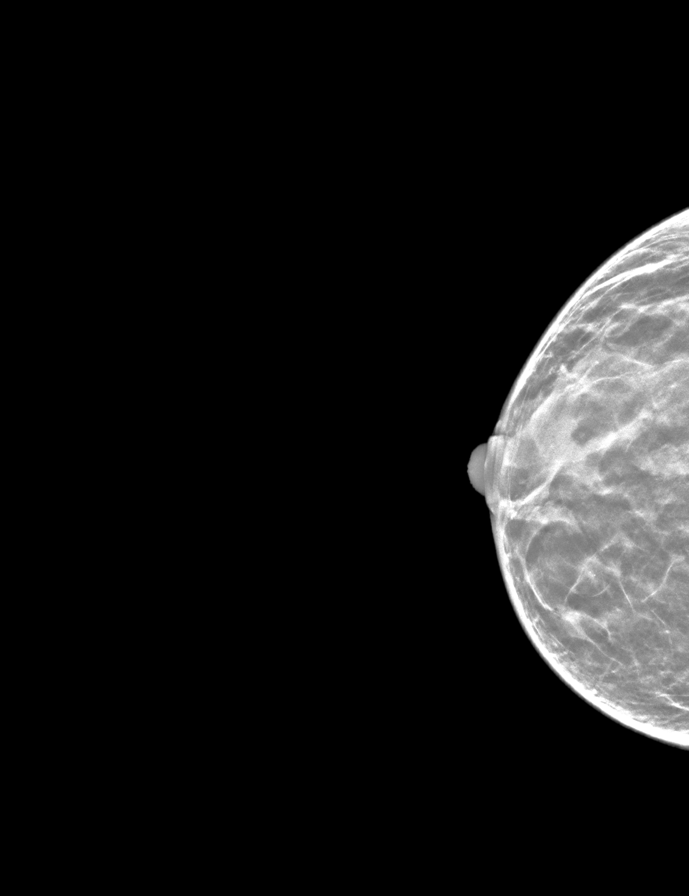

[R MLO]
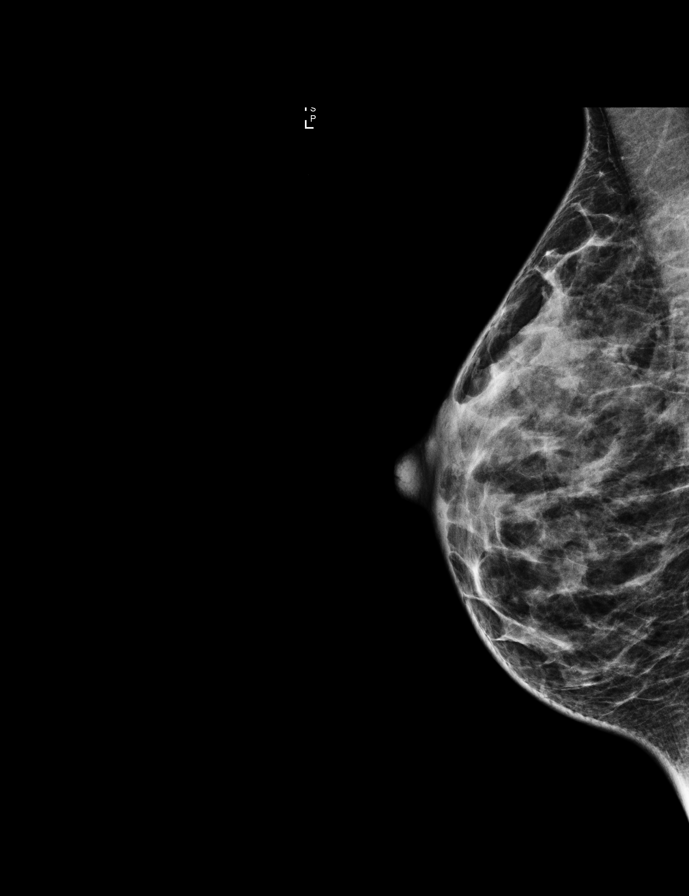

[R MLO synth-2D]
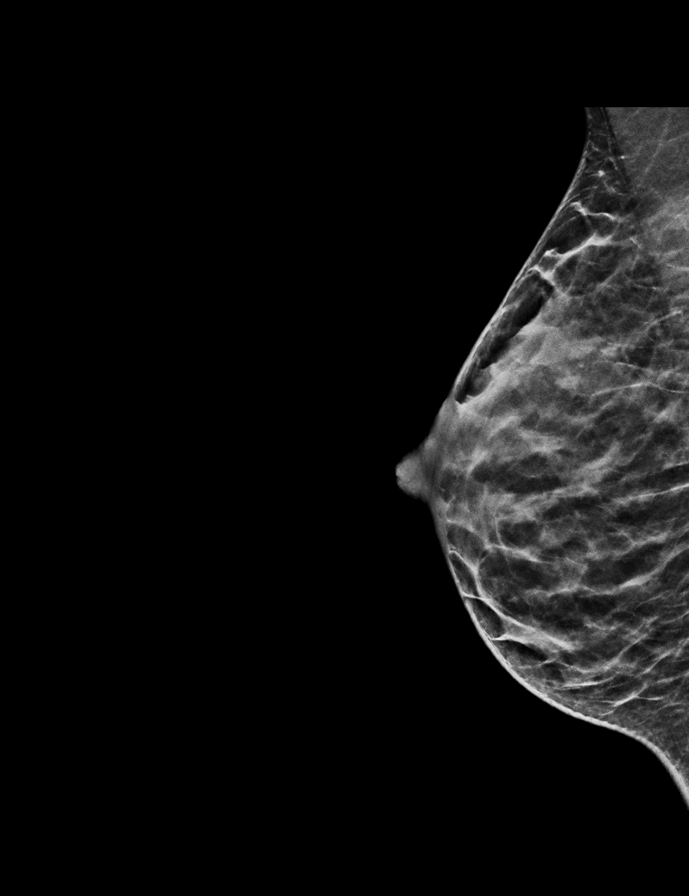

[R CC (2 of 2)]
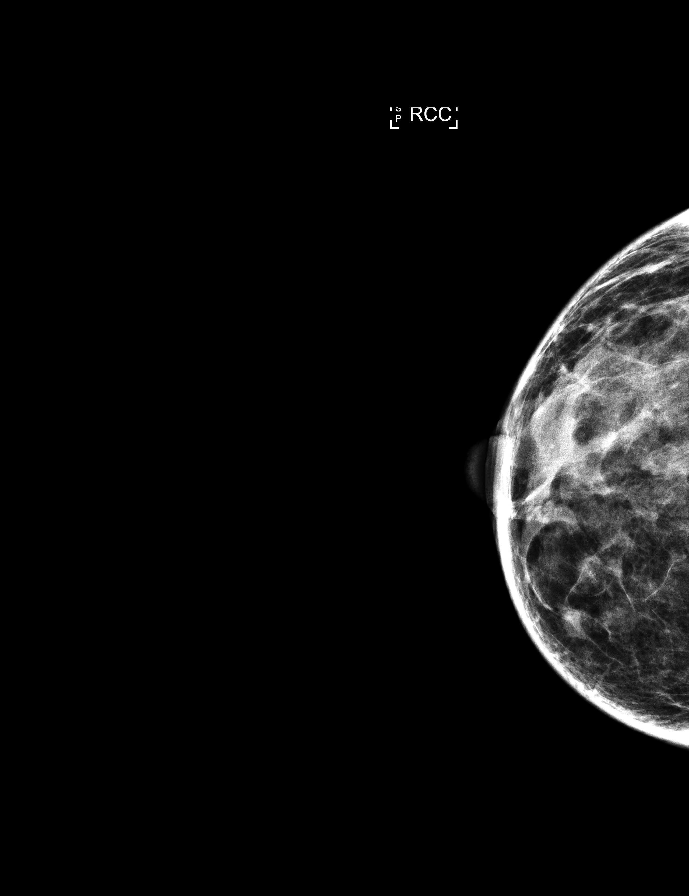

[R CC tomo · tomo slice 18/35.0]
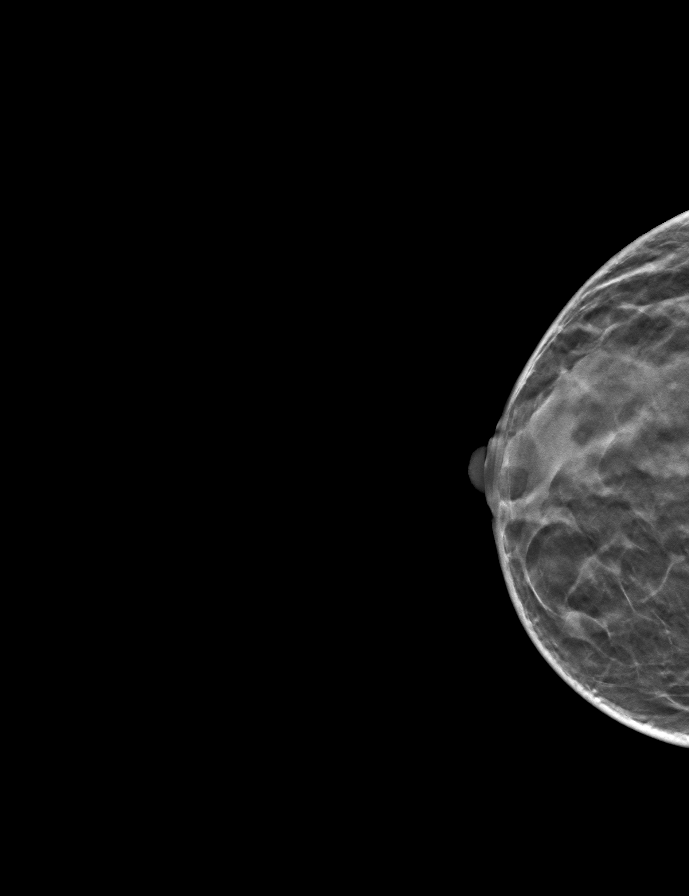

[8 of 17 positions shown; findings below may reference images not displayed]

ACR Breast Density Category c: The breast tissue is heterogeneously
dense, which may obscure small masses.
FINDINGS: There is a 2 x 2 x 1 mm group of microcalcifications in the lower
inner quadrant of the right breast in the middle third. On spot
magnification views, these are rounded and oval in shape. No
associated mass or architectural distortion. No findings elsewhere
in the breast suspicious for malignancy.

Mammographic images were processed with CAD.
IMPRESSION: 2 x 2 x 1 mm group of indeterminate microcalcifications in the lower
inner quadrant of the right breast. These have a low probability for
malignancy.

RECOMMENDATION:
If the patient decides to not have a mastectomy on the right,
stereotactic guided core needle biopsy of the 2 mm group of
calcifications would be recommended. That would probably not be
necessary if the patient is having a mastectomy, unless otherwise
clinically indicated. This has been discussed with the patient.

I have discussed the findings and recommendations with the patient.
Results were also provided in writing at the conclusion of the
visit. If applicable, a reminder letter will be sent to the patient
regarding the next appointment.

BI-RADS CATEGORY  Category 4A: Low suspicion for malignancy.

## 2017-02-16 ENCOUNTER — Ambulatory Visit: Payer: No Typology Code available for payment source | Admitting: Hematology

## 2017-03-05 ENCOUNTER — Other Ambulatory Visit: Payer: No Typology Code available for payment source

## 2017-03-05 ENCOUNTER — Ambulatory Visit: Payer: No Typology Code available for payment source | Admitting: Hematology

## 2017-03-09 ENCOUNTER — Telehealth: Payer: Self-pay | Admitting: Hematology

## 2017-03-09 ENCOUNTER — Other Ambulatory Visit: Payer: No Typology Code available for payment source

## 2017-03-09 ENCOUNTER — Ambulatory Visit: Payer: No Typology Code available for payment source | Admitting: Hematology

## 2017-03-09 NOTE — Telephone Encounter (Signed)
Patient called and needed to move her appointment dure to a sick child she is scheduled for 9/28 at 1:30 for labs and 2pm Dr Burr Medico.  Please let me know if need to make changes

## 2017-03-23 ENCOUNTER — Other Ambulatory Visit: Payer: No Typology Code available for payment source

## 2017-03-23 ENCOUNTER — Ambulatory Visit: Payer: No Typology Code available for payment source | Admitting: Hematology

## 2018-01-04 ENCOUNTER — Other Ambulatory Visit: Payer: No Typology Code available for payment source

## 2018-01-04 ENCOUNTER — Ambulatory Visit: Payer: No Typology Code available for payment source | Admitting: Hematology

## 2018-01-17 ENCOUNTER — Ambulatory Visit (INDEPENDENT_AMBULATORY_CARE_PROVIDER_SITE_OTHER): Payer: No Typology Code available for payment source | Admitting: Urgent Care

## 2018-01-17 DIAGNOSIS — Z23 Encounter for immunization: Secondary | ICD-10-CM

## 2018-01-17 NOTE — Progress Notes (Addendum)
Pt came in for Tdap vaccination, has been over 10 years

## 2018-01-17 NOTE — Patient Instructions (Signed)
     IF you received an x-ray today, you will receive an invoice from [facility]. Please contact [facility] at [phone] with questions or concerns regarding your invoice.   IF you received labwork today, you will receive an invoice from [facility]. Please contact [facility] at [phone] with questions or concerns regarding your invoice.   Our billing staff will not be able to assist you with questions regarding bills from these companies.  You will be contacted with the lab results as soon as they are available. The fastest way to get your results is to activate your My Chart account. Instructions are located on the last page of this paperwork. If you have not heard from us regarding the results in 2 weeks, please contact this office.     

## 2018-01-18 ENCOUNTER — Ambulatory Visit: Payer: Self-pay | Admitting: *Deleted

## 2018-01-18 NOTE — Telephone Encounter (Signed)
I called pt and asked her how she was doing due to the message that she is having itching and hives.  I asked the pt if she took any antihistamine. Pt states no that she didn't because she didn't want to be sleepy.  Pt states that symptoms has stabilized and thank me for calling and checking up on her. I states that if symptoms get worse or come back to please go to the ED. Pt states understand

## 2018-01-18 NOTE — Telephone Encounter (Signed)
This is great follow-up Vaughan Basta!  Good job!

## 2018-01-18 NOTE — Telephone Encounter (Signed)
Pt called having hives this morning. She received the TDAP yesterday around 5 or 5:30 and developed itching overnight and now has hives her stomach, left side thigh, arm and hip. She does have some tenderness on the right arm where the injection was place. And she took Ibuprofen for that last night. Denies any respiratory distress or any other symptom.  Advised to take an antihistamine and try taking a cool bath for the itching.  Will notify flow at [facility] for further recommendations. Advised patient to call back for increase in symptoms. Pt voiced understanding.   Reason for Disposition . Widespread hives  Answer Assessment - Initial Assessment Questions 1. APPEARANCE: "What does the rash look like?"      patches 2. LOCATION: "Where is the rash located?"      Hips, thighs, on the left side and stomach 3. NUMBER: "How many hives are there?"      About 3 that are big and little ones 4. SIZE: "How big are the hives?" (inches, cm, compare to coins) "Do they all look the same or is there lots of variation in shape and size?"      Patches size of palm  5. ONSET: "When did the hives begin?" (Hours or days ago)      Within the hour of getting the injection (TDAP) 6. ITCHING: "Does it itch?" If so, ask: "How bad is the itch?"    - MILD: doesn't interfere with normal activities   - MODERATE - SEVERE: interferes with work, school, sleep, or other activities      persistent itching 7. RECURRENT PROBLEM: "Have you had hives before?" If so, ask: "When was the last time?" and "What happened that time?"      no 8. TRIGGERS: "Were you exposed to any new food, plant, cosmetic product or animal just before the hives began?"     Medication  9. OTHER SYMPTOMS: "Do you have any other symptoms?" (e.g., fever, tongue swelling, difficulty breathing, abdominal pain)     no 10. PREGNANCY: "Is there any chance you are pregnant?" "When was your last menstrual period?"       No LMP  Due to start  now  Protocols used: HIVES-A-AH

## 2018-04-05 ENCOUNTER — Ambulatory Visit (INDEPENDENT_AMBULATORY_CARE_PROVIDER_SITE_OTHER): Payer: Self-pay | Admitting: Family Medicine

## 2018-04-05 ENCOUNTER — Telehealth: Payer: Self-pay | Admitting: General Practice

## 2018-04-05 DIAGNOSIS — Z111 Encounter for screening for respiratory tuberculosis: Secondary | ICD-10-CM

## 2018-04-05 NOTE — Telephone Encounter (Signed)
Called pt to let her know no appt.needed for Quantiferon TB test. VM full.unable to leave message.   When pt calls in please let pt know that they can walk in anytime schedule as nurse visit.

## 2018-04-05 NOTE — Patient Instructions (Addendum)
Pt is here for a TB skin test. Per pt she was told that she can get the Quantiferon TB skin test. She had a hives reaction on the left side of her body, after receiving a Tdap injection, she is unsure if the hives was from the Tdap. Pt stated that if the Quantiferon TB blood test is going to take 1-2 weeks for the results, then she prefers to take the TB skin test. I talked with Wilfred Curtis, Clinical Mgr, and she stated that Dr. Pamella Pert, stated that it is okay to get the TB skin test, if she didn't want to wait the duration of the 1-2 wks for Quantiferon TB blood test. Pt was given TB skin test and advised to return to the clinic in 48-72 hours. Sunday will be 48 hours, so she was advised to return to the clinic on Monday, 04/08/18 at 8:00 am.

## 2018-04-05 NOTE — Progress Notes (Signed)
  Tuberculosis Risk Questionnaire  1. No Were you born outside the [place] in one of the following parts of the world: [place], [place], Central [place], [place] America or Eastern [place]?    2. No Have you traveled outside the [place] and lived for more than one month in one of the following parts of the world: [place], [place], [place], [place] or Eastern [place]?    3. No Do you have a compromised immune system such as from any of the following conditions:HIV/AIDS, organ or bone marrow transplantation, diabetes, immunosuppressive medicines (e.g. Prednisone, Remicaide), leukemia, lymphoma, cancer of the head or neck, gastrectomy or jejunal bypass, end-stage renal disease (on dialysis), or silicosis?     4. No Have you ever or do you plan on working in: a residential care center, a health care facility, a jail or prison or homeless shelter?    5. No Have you ever: injected illegal drugs, used crack cocaine, lived in a homeless shelter  or been in jail or prison?     6. No Have you ever been exposed to anyone with infectious tuberculosis?  7. No Have you ever had a BCG vaccine? (BCG is a vaccine for tuberculosis  (TB) used in OTHER countries, NOT in the [place]).  8. No Have you ever been advised by a health care provider NOT to have a TB skin test?  9. No Have you ever had a POSITIVE TB skin test?  IF SO, when?   IF SO, were you treated with INH?  IF SO, where? Tuberculosis Symptom Questionnaire  Do you currently have any of the following symptoms?  1. No Unexplained cough lasting more than 3 weeks?   2. No Unexplained fever lasting more than 3 weeks.   3. No Night Sweats (sweating that leaves the bedclothes and sheets wet)     4. No Shortness of Breath   5. No Chest Pain   6. No Unintentional weight loss    7. No Unexplained fatigue (very tired for no reason)   

## 2018-04-08 ENCOUNTER — Ambulatory Visit: Payer: Self-pay | Admitting: Family Medicine

## 2018-04-08 DIAGNOSIS — Z111 Encounter for screening for respiratory tuberculosis: Secondary | ICD-10-CM

## 2018-04-08 LAB — TB SKIN TEST
Induration: 0 mm
TB Skin Test: NEGATIVE

## 2020-06-29 ENCOUNTER — Other Ambulatory Visit: Payer: Self-pay

## 2022-05-03 ENCOUNTER — Ambulatory Visit: Payer: PRIVATE HEALTH INSURANCE | Admitting: Obstetrics & Gynecology

## 2022-05-03 ENCOUNTER — Encounter: Payer: Self-pay | Admitting: Obstetrics & Gynecology

## 2022-05-03 VITALS — BP 110/74 | HR 57 | Ht 67.25 in | Wt 134.0 lb

## 2022-05-03 DIAGNOSIS — C50212 Malignant neoplasm of upper-inner quadrant of left female breast: Secondary | ICD-10-CM

## 2022-05-03 DIAGNOSIS — Z170 Estrogen receptor positive status [ER+]: Secondary | ICD-10-CM | POA: Diagnosis not present

## 2022-05-03 DIAGNOSIS — R9389 Abnormal findings on diagnostic imaging of other specified body structures: Secondary | ICD-10-CM | POA: Diagnosis not present

## 2022-05-03 NOTE — Progress Notes (Signed)
    Gabriela [name] 03/30/1971 [id number]        51 y.o.  G2P2L2   RP: Thickened Endometrium  HPI: Postmenopause x at least 2 years, well on no HRT.  No PMB.  H/O Lt Breast Ca, ER Positive.  She was on Tamoxifen which was stopped at least 5 years ago.  Lower abdominal discomfort probably d/t chronic constipation.  Recent Colono normal per patient.  Last Pelvic US 09/22/21 was normal except for the endometrial line which was at 7 mm.  She was recommended a HSC/D+C, but she would like a second opinion on the best management.   OB History  Gravida Para Term Preterm AB Living  '2 2 2     2  '$ SAB IAB Ectopic Multiple Live Births               # Outcome Date GA Lbr Len/2nd Weight Sex Delivery Anes PTL Lv  2 Term           1 Term             Past medical history,surgical history, problem list, medications, allergies, family history and social history were all reviewed and documented in the EPIC chart.   Directed ROS with pertinent positives and negatives documented in the history of present illness/assessment and plan.  Exam:  Vitals:   05/03/22 1156  BP: 110/74  Pulse: (!) 57  SpO2: 98%  Weight: 134 lb (60.8 kg)  Height: 5' 7.25" (1.708 m)   General appearance:  Normal  Abdomen: Normal  Gynecologic exam: Vulva normal.  Speculum:  Cervix/Vagina normal.  Normal secretions.  No blood.  Bimanual exam:  Uterus AV, normal volume, mobile, NT.  No adnexal mass, NT.  Pelvic US 09/22/21:  .  Uterus: The uterus measures 7.2 x 4.0 x 3.4 cm. Normal size, contour, and echotexture. No focal lesion is identified. The endometrial echo complex measures 7 mm in width.  .  Right ovary/adnexa: Normal size, contour, and echotexture. No mass. The ovary measures 1.9 x 1.2 x 1.2 cm. Volume = 1 mL.  Marland Kitchen  Left ovary/adnexa: Normal size, contour, and echotexture. No mass. The ovary measures 1.5 x 1.7 x 1.1 cm. Volume = 1 mL.  Marland Kitchen  Peritoneum: No fluid in the cul-de-sac.  Marland Kitchen  Other: None.      Assessment/Plan:  51 y.o. G2P2002   1. Thickened endometrium Postmenopause x at least 2 years, well on no HRT.  No PMB.  H/O Lt Breast Ca, ER Positive.  She was on Tamoxifen which was stopped at least 5 years ago.  Lower abdominal discomfort probably d/t chronic constipation.  Recent Colono normal per patient.  Last Pelvic US 09/22/21 was normal except for the endometrial line which was at 7 mm.  She was recommended a HSC/D+C, but she would like a second opinion on the best management.  Normal Gyn exam today.  Counseling on thickened endometrial line done.  Decision to reinvestigate by Pelvic US here now.  Will decide on management per Pelvic US findings at f/u.  Patient voiced understanding and agreement with the plan. - US Transvaginal Non-OB; Future  2. Malignant neoplasm of upper-inner quadrant of left breast in female, estrogen receptor positive ([place]) Stopped Tamoxifen at least 5 years ago.  Other orders - ALPRAZolam (XANAX) 0.5 MG tablet; TK 1 T PO TID PRN - VITAMIN D PO; Take 5,000 Int'l Units by mouth.   [name] MD, 12:09 PM 05/03/2022

## 2022-05-07 ENCOUNTER — Encounter: Payer: Self-pay | Admitting: Obstetrics & Gynecology

## 2022-05-08 NOTE — Telephone Encounter (Signed)
Patient needs ultrasound scheduled.

## 2022-05-11 ENCOUNTER — Ambulatory Visit (INDEPENDENT_AMBULATORY_CARE_PROVIDER_SITE_OTHER): Payer: PRIVATE HEALTH INSURANCE

## 2022-05-11 ENCOUNTER — Ambulatory Visit: Payer: PRIVATE HEALTH INSURANCE | Admitting: Obstetrics & Gynecology

## 2022-05-11 VITALS — BP 112/70 | HR 62

## 2022-05-11 DIAGNOSIS — Z170 Estrogen receptor positive status [ER+]: Secondary | ICD-10-CM

## 2022-05-11 DIAGNOSIS — C50212 Malignant neoplasm of upper-inner quadrant of left female breast: Secondary | ICD-10-CM | POA: Diagnosis not present

## 2022-05-11 DIAGNOSIS — R9389 Abnormal findings on diagnostic imaging of other specified body structures: Secondary | ICD-10-CM

## 2022-05-11 NOTE — Progress Notes (Signed)
    Gabriela [name] [birth year] [id number]        51 y.o.  G2P2L2   RP: Endometrial thickening for Pelvic US  HPI: Seen on 05/03/22: Postmenopause x at least 2 years, well on no HRT.  No PMB.  H/O Lt Breast Ca, ER Positive.  She was on Tamoxifen for a very short time, which was stopped at least 5 years ago.  Lower abdominal discomfort probably d/t chronic constipation.  Recent Colono normal per patient.  Last Pelvic US 09/22/21 was normal except for the endometrial line which was at 7 mm.  She was recommended a HSC/D+C, but she would like a second opinion on the best management.  No PMB still.  No pelvic pain. C/O hemorrhoids which are very small and non-thrombosed.  Sxs when constipated, recommend reevaluation with Gastro and CS cream as needed.  Urine/BMs normal.  No fever.   OB History  Gravida Para Term Preterm AB Living  '2 2 2     2  '$ SAB IAB Ectopic Multiple Live Births               # Outcome Date GA Lbr Len/2nd Weight Sex Delivery Anes PTL Lv  2 Term           1 Term             Past medical history,surgical history, problem list, medications, allergies, family history and social history were all reviewed and documented in the EPIC chart.   Directed ROS with pertinent positives and negatives documented in the history of present illness/assessment and plan.  Exam:  There were no vitals filed for this visit. General appearance:  Normal  Pelvic US today: T/V images.  Anteverted uterus normal in size and shape with no myometrial mass.  The overall uterine size is measured at 6.93 x 3.9 x 3.14 cm.  Symmetrical endometrium at 3.96 mm with no mass or thickening or abnormal blood flow seen.  Both ovaries are normal with atrophic appearance.  No adnexal mass.  No free fluid in the pelvis.   Assessment/Plan:  51 y.o. G2P2002   1. Thickened endometrium Seen on 05/03/22: Postmenopause x at least 2 years, well on no HRT.  No PMB.  H/O Lt Breast Ca, ER Positive.  She was on Tamoxifen for  a very short time, which was stopped at least 5 years ago.  Lower abdominal discomfort probably d/t chronic constipation.  Recent Colono normal per patient.  Last Pelvic US 09/22/21 was normal except for the endometrial line which was at 7 mm.  She was recommended a HSC/D+C, but she would like a second opinion on the best management.  No PMB still.  No pelvic pain. C/O hemorrhoids which are very small and non-thrombosed.  Sxs when constipated, recommend reevaluation with Gastro and CS cream as needed.  Urine/BMs normal.  No fever. Pelvic US today very reassuring with an endometrial line at 3.96 mm, no mass or thickening or abnormal blood flow seen.  No evidence of Polyps.  Given a thin normal endometrial line and no PMB, decision to repeat a Pelvic US for confirmation in 3 months.  EBx offered, but not strongly recommended at this time, patient declined EBx. - US Transvaginal Non-OB; Future in 3 months  2. Malignant neoplasm of upper-inner quadrant of left breast in female, estrogen receptor positive ([facility])   [name] MD, 2:50 PM 05/11/2022

## 2022-05-13 ENCOUNTER — Encounter: Payer: Self-pay | Admitting: Obstetrics & Gynecology

## 2022-08-10 ENCOUNTER — Other Ambulatory Visit: Payer: PRIVATE HEALTH INSURANCE | Admitting: Obstetrics & Gynecology

## 2022-08-10 ENCOUNTER — Other Ambulatory Visit: Payer: PRIVATE HEALTH INSURANCE

## 2022-09-07 ENCOUNTER — Ambulatory Visit (INDEPENDENT_AMBULATORY_CARE_PROVIDER_SITE_OTHER): Payer: PRIVATE HEALTH INSURANCE | Admitting: Obstetrics & Gynecology

## 2022-09-07 ENCOUNTER — Other Ambulatory Visit (HOSPITAL_COMMUNITY)
Admission: RE | Admit: 2022-09-07 | Discharge: 2022-09-07 | Disposition: A | Discharge status: Home / Self Care | Payer: PRIVATE HEALTH INSURANCE | Source: Ambulatory Visit | Attending: Obstetrics & Gynecology | Admitting: Obstetrics & Gynecology

## 2022-09-07 ENCOUNTER — Encounter: Payer: Self-pay | Admitting: Obstetrics & Gynecology

## 2022-09-07 VITALS — BP 100/64 | HR 60 | Ht 67.25 in | Wt 137.0 lb

## 2022-09-07 DIAGNOSIS — Z01419 Encounter for gynecological examination (general) (routine) without abnormal findings: Secondary | ICD-10-CM

## 2022-09-07 DIAGNOSIS — K589 Irritable bowel syndrome without diarrhea: Secondary | ICD-10-CM

## 2022-09-07 DIAGNOSIS — C50212 Malignant neoplasm of upper-inner quadrant of left female breast: Secondary | ICD-10-CM

## 2022-09-07 DIAGNOSIS — Z780 Asymptomatic menopausal state: Secondary | ICD-10-CM | POA: Diagnosis not present

## 2022-09-07 DIAGNOSIS — Z170 Estrogen receptor positive status [ER+]: Secondary | ICD-10-CM

## 2022-09-07 NOTE — Progress Notes (Signed)
Gabriela [name] [birth year] [id number]   History:    52 y.o. G2P2L2  RP:  Established patient presenting for annual gyn exam   HPI: Postmenopause, well on no HRT.  No PMB.  Seen on 05/11/22 for an Endometrial line thickened at 7 mm with no PMB.  On Pelvic US 05/11/22 the endometrial line was thin and symmetrical at 3.96 mm with no mass or increased blood flow.  Patient was reassured.  No pelvic pain, but c/o generalized abdominal discomfort.  Previously diagnosed with IBS.  Followed by Gertie Fey.  Recommend making an appointment with her Gastro.  Colono Neg in 2021.  Abstinent. PAP normal a couple of years ago per patient.  Pap reflex today.  S/P Bilateral Mastectomy.  Followed by Romie Minus.  Last Mammo in 2017.  BMI 21.3.  Health labs with Fam MD.  Past medical history,surgical history, family history and social history were all reviewed and documented in the EPIC chart.  Gynecologic History Patient's last menstrual period was 10/14/2015.  Obstetric History OB History  Gravida Para Term Preterm AB Living  '2 2 2     2  '$ SAB IAB Ectopic Multiple Live Births               # Outcome Date GA Lbr Len/2nd Weight Sex Delivery Anes PTL Lv  2 Term           1 Term              ROS: A ROS was performed and pertinent positives and negatives are included in the history. GENERAL: No fevers or chills. HEENT: No change in vision, no earache, sore throat or sinus congestion. NECK: No pain or stiffness. CARDIOVASCULAR: No chest pain or pressure. No palpitations. PULMONARY: No shortness of breath, cough or wheeze. GASTROINTESTINAL: No abdominal pain, nausea, vomiting or diarrhea, melena or bright red blood per rectum. GENITOURINARY: No urinary frequency, urgency, hesitancy or dysuria. MUSCULOSKELETAL: No joint or muscle pain, no back pain, no recent trauma. DERMATOLOGIC: No rash, no itching, no lesions. ENDOCRINE: No polyuria, polydipsia, no heat or cold intolerance. No recent change in weight. HEMATOLOGICAL: No  anemia or easy bruising or bleeding. NEUROLOGIC: No headache, seizures, numbness, tingling or weakness. PSYCHIATRIC: No depression, no loss of interest in normal activity or change in sleep pattern.     Exam:   BP 100/64   Pulse 60   Ht 5' 7.25" (1.708 m)   Wt 137 lb (62.1 kg)   LMP 10/14/2015 Comment: not sexually active  SpO2 97%   BMI 21.30 kg/m   Body mass index is 21.3 kg/m.  General appearance : Well developed well nourished female. No acute distress HEENT: Eyes: no retinal hemorrhage or exudates,  Neck supple, trachea midline, no carotid bruits, no thyroidmegaly Lungs: Clear to auscultation, no rhonchi or wheezes, or rib retractions  Heart: Regular rate and rhythm, no murmurs or gallops Breast: S/P Bilateral Mastectomy.  No mass or nodule felt.  No axillary or supraclavicular lymphadenopathy Abdomen: no palpable masses or tenderness, no rebound or guarding Extremities: no edema or skin discoloration or tenderness  Pelvic: Vulva: Normal             Vagina: No gross lesions or discharge  Cervix: No gross lesions or discharge.  Pap reflex done.  Uterus  AV, normal size, shape and consistency, non-tender and mobile  Adnexa  Without masses or tenderness  Anus: Normal  Pelvic US 05/11/22: T/V images.  Anteverted uterus normal in size and shape  with no myometrial mass.  The overall uterine size is measured at 6.93 x 3.9 x 3.14 cm. Symmetrical endometrium at 3.96 mm with no mass or thickening or abnormal blood flow seen.  Both ovaries are normal with atrophic appearance.  No adnexal mass.  No free fluid in the pelvis.     Assessment/Plan:  52 y.o. female for annual exam   1. Encounter for routine gynecological examination with Papanicolaou smear of cervix Postmenopause, well on no HRT.  No PMB.  Seen on 05/11/22 for an Endometrial line thickened at 7 mm with no PMB.  On Pelvic US 05/11/22 the endometrial line was thin and symmetrical at 3.96 mm with no mass or increased blood  flow.  Patient was reassured.  No pelvic pain, but c/o generalized abdominal discomfort.  Previously diagnosed with IBS.  Followed by Gertie Fey.  Recommend making an appointment with her Gastro.  Colono Neg in 2021.  Abstinent. PAP normal a couple of years ago per patient.  Pap reflex today.  S/P Bilateral Mastectomy for Lt Breast Ca.  Followed by Romie Minus.  Last Mammo in 2017.  BMI 21.3.  Health labs with Fam MD. - Cytology - PAP( [place])  2. Postmenopause Postmenopause, well on no HRT.  No PMB.   3. Malignant neoplasm of upper-inner quadrant of left breast in female, estrogen receptor positive ([place]) Bilateral Mastectomy for Lt Breast Ca.  Followed by Romie Minus.  Last Mammo in 2017.    4. Irritable bowel syndrome, unspecified type  No pelvic pain, but c/o generalized abdominal discomfort.  Previously diagnosed with IBS.  Followed by Gertie Fey.  Recommend making an appointment with her Gastro.  Colono Neg in 2021.    [name] MD, 3:18 PM

## 2022-09-13 LAB — CYTOLOGY - PAP
Comment: NEGATIVE
Diagnosis: NEGATIVE
High risk HPV: NEGATIVE

## 2022-12-21 ENCOUNTER — Encounter (HOSPITAL_BASED_OUTPATIENT_CLINIC_OR_DEPARTMENT_OTHER): Payer: Self-pay | Admitting: Family Medicine

## 2022-12-29 ENCOUNTER — Ambulatory Visit (HOSPITAL_BASED_OUTPATIENT_CLINIC_OR_DEPARTMENT_OTHER): Payer: PRIVATE HEALTH INSURANCE | Admitting: Family Medicine

## 2023-01-10 DIAGNOSIS — F4323 Adjustment disorder with mixed anxiety and depressed mood: Secondary | ICD-10-CM | POA: Diagnosis not present

## 2023-01-17 ENCOUNTER — Ambulatory Visit (HOSPITAL_BASED_OUTPATIENT_CLINIC_OR_DEPARTMENT_OTHER): Payer: PRIVATE HEALTH INSURANCE | Admitting: Family Medicine

## 2023-02-14 DIAGNOSIS — F4323 Adjustment disorder with mixed anxiety and depressed mood: Secondary | ICD-10-CM | POA: Diagnosis not present

## 2023-02-28 NOTE — Progress Notes (Deleted)
New Patient Office Visit  Subjective:   Gabriela [name] [birth year] 03/01/2023  No chief complaint on file.   HPI: [name] presents today to establish care at Primary Care and Sports Medicine at [facility]. Introduced to Publishing rights manager role and practice setting.  All questions answered.   Last PCP: *** Last annual physical: *** Concerns: See below    The following portions of the patient's history were reviewed and updated as appropriate: past medical history, past surgical history, family history, social history, allergies, medications, and problem list.   Patient Active Problem List   Diagnosis Date Noted   Genetic testing 11/25/2015   Family history of breast cancer    Breast cancer of upper-inner quadrant of left female breast (HCC) 09/09/2015   Past Medical History:  Diagnosis Date   Anemia    "mild" (09/23/2015)   Anxiety    situational   Breast cancer of upper-inner quadrant of left female breast (HCC) 09/09/2015   Chronic left shoulder pain    Constipation    Depression    Family history of breast cancer    Hemorrhoids    IBS (irritable bowel syndrome)    IC (interstitial cystitis)    Levator syndrome    levator ani syndrome   Neuromuscular disorder (HCC)    nerve pain under left arm   Past Surgical History:  Procedure Laterality Date   BREAST SURGERY Left 2017   MASTECTOMY COMPLETE / SIMPLE Right 09/23/2015   PROPHYLACTIC    MASTECTOMY COMPLETE / SIMPLE W/ SENTINEL NODE BIOPSY Left 09/23/2015   AXILLARY SENTINEL LYMPH NODE BIOPSY   MASTECTOMY W/ SENTINEL NODE BIOPSY Bilateral 09/23/2015   Procedure: LEFT TOTAL MASTECTOMY WITH LEFT AXILLARY SENTINEL LYMPH NODE BIOPSY, RIGHT PROPHYLACTIC TOTAL MASTECTOMY;  Surgeon: Emelia Loron, MD;  Location: [facility] OR;  Service: General;  Laterality: Bilateral;   RE-EXCISION OF BREAST CANCER,SUPERIOR MARGINS N/A 10/18/2015   Procedure: EXCISION OF SKIN FOR CLOSE MARGIN--ANTERIOR MARGIN;   Surgeon: Emelia Loron, MD;  Location: [facility];  Service: General;  Laterality: N/A;   TONSILLECTOMY     WISDOM TOOTH EXTRACTION     Family History  Problem Relation Age of Onset   Breast cancer Mother 57   Breast cancer Maternal Grandmother 54   Congestive Heart Failure Maternal Grandmother    Lung cancer Maternal Grandfather    Social History   Socioeconomic History   Marital status: Single    Spouse name: Not on file   Number of children: 2   Years of education: Not on file   Highest education level: Not on file  Occupational History   Not on file  Tobacco Use   Smoking status: Never   Smokeless tobacco: Never  Substance and Sexual Activity   Alcohol use: Not Currently   Drug use: No   Sexual activity: Not Currently    Partners: Male    Birth control/protection: Post-menopausal  Other Topics Concern   Not on file  Social History Narrative   ** Merged History Encounter **       Social Determinants of Health   Financial Resource Strain: Low Risk  (04/26/2021)   [facility] prior to 08/26/2022., [facility] visits prior to 08/26/2022.   Overall Financial Resource Strain (CARDIA)    Difficulty of Paying Living Expenses: Not hard at all  Food Insecurity: Unknown (04/26/2021)   [facility] prior  to 08/26/2022., [facility] visits prior to 08/26/2022.   Hunger Vital Sign    Worried About Running Out of Food in the Last Year: Never true    Ran Out of Food in the Last Year: Not on file  Transportation Needs: No Transportation Needs (04/26/2021)   [facility] prior to 08/26/2022., [facility] visits prior to 08/26/2022.   PRAPARE - Administrator, Civil Service (Medical): No    Lack of Transportation (Non-Medical): No  Physical Activity: Sufficiently Active  (04/26/2021)   Received from [facility] visits prior to 08/26/2022., [facility] visits prior to 08/26/2022.   Exercise Vital Sign    Days of Exercise per Week: 4 days    Minutes of Exercise per Session: 40 min  Stress: Stress Concern Present (04/26/2021)   [facility] prior to 08/26/2022., [facility] visits prior to 08/26/2022.   [employer] of Occupational Health - Occupational Stress Questionnaire    Feeling of Stress : To some extent  Social Connections: Moderately Integrated (04/26/2021)   Received from [facility] visits prior to 08/26/2022., [facility] visits prior to 08/26/2022.   Social Advertising account executive [NHANES]    Frequency of Communication with Friends and Family: More than three times a week    Frequency of Social Gatherings with Friends and Family: More than three times a week    Attends Religious Services: More than 4 times per year    Active Member of [employer] or Organizations: Yes    Attends Banker Meetings: More than 4 times per year    Marital Status: Divorced  Intimate Partner Violence: Not At Risk (04/26/2021)   [facility] prior to 08/26/2022., [facility] visits prior to 08/26/2022.   Humiliation, Afraid, Rape, and Kick questionnaire    Fear of Current or Ex-Partner: No    Emotionally Abused: No    Physically Abused: No    Sexually Abused: No   Outpatient Medications Prior to Visit  Medication Sig Dispense Refill   b complex vitamins tablet Take 1 tablet by mouth daily.     Omega-3 Fatty Acids (OMEGA 3 PO) Take 2 tablets by mouth daily.     VITAMIN D PO Take 5,000 Int'l Units by mouth.     No facility-administered medications prior to visit.   Allergies  Allergen Reactions   Sulfa Antibiotics Other (See Comments)    Sulfa drugs -  Discomfort    ROS: A complete ROS was performed with pertinent positives/negatives noted in the HPI. The remainder of the ROS are negative.   Objective:   There were no vitals filed for this visit.  GENERAL: Well-appearing, in NAD. Well nourished.  SKIN: Pink, warm and dry. No rash, lesion, ulceration, or ecchymoses.  Head: Normocephalic. NECK: Trachea midline. Full ROM w/o pain or tenderness. No lymphadenopathy.  EARS: Tympanic membranes are intact, translucent without bulging and without drainage. Appropriate landmarks visualized.  EYES: Conjunctiva clear without exudates. EOMI, PERRL, no drainage present.  NOSE: Septum midline w/o deformity. Nares patent, mucosa pink and non-inflamed w/o drainage. No sinus tenderness.  THROAT: Uvula midline. Oropharynx clear. Tonsils non-inflamed without exudate. Mucous membranes pink and moist.  RESPIRATORY: Chest wall symmetrical. Respirations even and non-labored. Breath sounds clear to auscultation bilaterally.  CARDIAC: S1, S2 present, regular rate and rhythm without murmur or gallops. Peripheral pulses 2+ bilaterally.  MSK: Muscle tone and strength appropriate for age. Joints w/o tenderness, redness, or swelling.  EXTREMITIES: Without clubbing, cyanosis, or edema.  NEUROLOGIC: No motor or sensory deficits. Steady, even gait. C2-C12 intact.  PSYCH/MENTAL STATUS: Alert, oriented x 3. Cooperative, appropriate mood and affect.    Health Maintenance Due  Topic Date Due   COVID-19 Vaccine (1) Never done   HIV Screening  Never done   Hepatitis C Screening  Never done   Zoster Vaccines- Shingrix (1 of 2) Never done   Colonoscopy  Never done   MAMMOGRAM  Never done   INFLUENZA VACCINE  01/25/2023    No results found for any visits on 03/01/23.     Assessment & Plan:  There are no diagnoses linked to this encounter.    Patient to reach out to office if new, worrisome, or unresolved symptoms arise or if no improvement in patient's condition.  Patient verbalized understanding and is agreeable to treatment plan. All questions answered to patient's satisfaction.    No follow-ups on file.   Of note, portions of this note may have been created with voice recognition software Physicist, medical). While this note has been edited for accuracy, occasional wrong-word or 'sound-a-like' substitutions may have occurred due to the inherent limitations of voice recognition software.  Yolanda Manges, FNP

## 2023-03-01 ENCOUNTER — Ambulatory Visit (HOSPITAL_BASED_OUTPATIENT_CLINIC_OR_DEPARTMENT_OTHER): Payer: PRIVATE HEALTH INSURANCE | Admitting: Family Medicine

## 2023-03-14 DIAGNOSIS — F4323 Adjustment disorder with mixed anxiety and depressed mood: Secondary | ICD-10-CM | POA: Diagnosis not present

## 2023-04-09 DIAGNOSIS — F4323 Adjustment disorder with mixed anxiety and depressed mood: Secondary | ICD-10-CM | POA: Diagnosis not present

## 2023-04-30 ENCOUNTER — Emergency Department (HOSPITAL_BASED_OUTPATIENT_CLINIC_OR_DEPARTMENT_OTHER)
Admission: EM | Admit: 2023-04-30 | Discharge: 2023-04-30 | Disposition: A | Discharge status: Home / Self Care | Payer: 59 | Attending: Emergency Medicine | Admitting: Emergency Medicine

## 2023-04-30 ENCOUNTER — Other Ambulatory Visit: Payer: Self-pay

## 2023-04-30 DIAGNOSIS — R202 Paresthesia of skin: Secondary | ICD-10-CM

## 2023-04-30 DIAGNOSIS — R200 Anesthesia of skin: Secondary | ICD-10-CM | POA: Diagnosis present

## 2023-04-30 DIAGNOSIS — Z853 Personal history of malignant neoplasm of breast: Secondary | ICD-10-CM | POA: Insufficient documentation

## 2023-04-30 DIAGNOSIS — R791 Abnormal coagulation profile: Secondary | ICD-10-CM | POA: Insufficient documentation

## 2023-04-30 LAB — COMPREHENSIVE METABOLIC PANEL
ALT: 10 U/L (ref 0–44)
AST: 18 U/L (ref 15–41)
Albumin: 4.4 g/dL (ref 3.5–5.0)
Alkaline Phosphatase: 66 U/L (ref 38–126)
Anion gap: 9 (ref 5–15)
BUN: 19 mg/dL (ref 6–20)
CO2: 27 mmol/L (ref 22–32)
Calcium: 9.8 mg/dL (ref 8.9–10.3)
Chloride: 103 mmol/L (ref 98–111)
Creatinine, Ser: 0.89 mg/dL (ref 0.44–1.00)
GFR, Estimated: >60 mL/min (ref >60)
Glucose, Bld: 106 mg/dL — ABNORMAL HIGH (ref 70–99)
Potassium: 3.8 mmol/L (ref 3.5–5.1)
Sodium: 139 mmol/L (ref 135–145)
Total Bilirubin: 0.4 mg/dL (ref <1.2)
Total Protein: 7 g/dL (ref 6.5–8.1)

## 2023-04-30 LAB — DIFFERENTIAL
Abs Immature Granulocytes: 0.01 10*3/uL (ref 0.00–0.07)
Basophils Absolute: 0.1 10*3/uL (ref 0.0–0.1)
Basophils Relative: 1 %
Eosinophils Absolute: 0.1 10*3/uL (ref 0.0–0.5)
Eosinophils Relative: 1 %
Immature Granulocytes: 0 %
Lymphocytes Relative: 33 %
Lymphs Abs: 2 10*3/uL (ref 0.7–4.0)
Monocytes Absolute: 0.5 10*3/uL (ref 0.1–1.0)
Monocytes Relative: 8 %
Neutro Abs: 3.3 10*3/uL (ref 1.7–7.7)
Neutrophils Relative %: 57 %

## 2023-04-30 LAB — CBC
HCT: 37.4 % (ref 36.0–46.0)
Hemoglobin: 12.6 g/dL (ref 12.0–15.0)
MCH: 30.1 pg (ref 26.0–34.0)
MCHC: 33.7 g/dL (ref 30.0–36.0)
MCV: 89.3 fL (ref 80.0–100.0)
Platelets: 327 10*3/uL (ref 150–400)
RBC: 4.19 MIL/uL (ref 3.87–5.11)
RDW: 13.1 % (ref 11.5–15.5)
WBC: 5.8 10*3/uL (ref 4.0–10.5)
nRBC: 0 % (ref 0.0–0.2)

## 2023-04-30 LAB — PROTIME-INR
INR: 1 (ref 0.8–1.2)
Prothrombin Time: 12.9 s (ref 11.4–15.2)

## 2023-04-30 LAB — APTT: aPTT: 31 s (ref 24–36)

## 2023-04-30 LAB — ETHANOL: Alcohol, Ethyl (B): <10 mg/dL (ref <10)

## 2023-04-30 NOTE — ED Notes (Signed)
Pt expresses several concerns about going to another department and possible charges. This RN attempts to answer her questions by having registration come talk to her. DO and this RN explain our greatest concern is her health and getting her to CT scanner ASAP. Risks and the gravity of the situation is explained to this patient by the DO and RN many times. Pt refuses IV and only allows for blood draw. This RN explains the possible need for an IV as care continues at [facility] ED. Labs drawn and Pt sent with friend who drove her to transfer to [facility] ED. Charge Information systems manager notified. Zavitz MD accepts.

## 2023-04-30 NOTE — ED Notes (Signed)
Pt stated to this tech she will be leaving and does not wish to stay any longer. Moved to [facility].

## 2023-04-30 NOTE — ED Triage Notes (Signed)
1 week of numbness in right hand and foot. Has heard thumping sound in right ear as well. Has progressed until today felt entire right side of body numb- @ 1345.

## 2023-04-30 NOTE — Discharge Instructions (Addendum)
You were seen in the emergency department for numbness of your right hand and right lower extremity As discussed our CAT scan is currently not functioning here You declined offer for EMS transport to [facility] as you said you will have your friend drive you Is very important that you go directly to [facility] for further evaluation to have a CAT scan taken and be seen by a physician in the emergency department

## 2023-04-30 NOTE — ED Provider Notes (Signed)
[place] EMERGENCY DEPARTMENT AT [facility] Provider Note   CSN: [id number] Arrival date & time: 04/30/23  1516     History  Chief Complaint  Patient presents with   Numbness    Gabriela [name] is a 52 y.o. female.  With a past medical history of breast cancer who presents to the ED for paresthesias.  1 week ago she first experienced a "thumping sound" in her right ear.  Since that time she has had intermittent episodes of numbness and tingling in her right hand and right foot.  Around 1345 while at work today she began to experience persistent paresthesias and the right hand and right foot as well as in her right face that have continued since the onset.  No focal weakness, headaches, change in speech, chest pain, shortness of breath or other complaints at this time.  Patient took 324 mg p.o. aspirin earlier today.  HPI     Home Medications Prior to Admission medications   Medication Sig Start Date End Date Taking? Authorizing Provider  b complex vitamins tablet Take 1 tablet by mouth daily.    [provider]  Omega-3 Fatty Acids (OMEGA 3 PO) Take 2 tablets by mouth daily.    [provider]  VITAMIN D PO Take 5,000 Int'l Units by mouth.    [provider]      Allergies    Sulfa antibiotics    Review of Systems   Review of Systems  Physical Exam Updated Vital Signs BP 133/80 (BP Location: Right Arm)   Pulse 60   Temp 98.4 F (36.9 C)   LMP 10/14/2015 Comment: not sexually active  SpO2 100%  Physical Exam Vitals and nursing note reviewed.  HENT:     Head: Normocephalic and atraumatic.  Eyes:     Pupils: Pupils are equal, round, and reactive to light.  Cardiovascular:     Rate and Rhythm: Normal rate and regular rhythm.  Pulmonary:     Effort: Pulmonary effort is normal.     Breath sounds: Normal breath sounds.  Abdominal:     Palpations: Abdomen is soft.     Tenderness: There is no abdominal tenderness.  Skin:     General: Skin is warm and dry.  Neurological:     Mental Status: She is alert.     Comments: Diminished sensation to light touch in right hand right forearm and distal right lower extremity 5 out of 5 motor strength bilateral upper and lower extremities No pronator drift No facial asymmetry Clear fluent speech  Psychiatric:        Mood and Affect: Mood normal.     ED Results / Procedures / Treatments   Labs (all labs ordered are listed, but only abnormal results are displayed) Labs Reviewed  ETHANOL  PROTIME-INR  APTT  CBC  DIFFERENTIAL  COMPREHENSIVE METABOLIC PANEL    EKG None  Radiology No results found.  Procedures Procedures    Medications Ordered in ED Medications - No data to display  ED Course/ Medical Decision Making/ A&P                                 Medical Decision Making 52 year old female with history as above presenting given concern for new onset paresthesias.  Time of onset 1345 today.  Symptoms include right face numbness, right hand paresthesias and right lower extremity paresthesias.  No focal weakness or other deficits.  No changes in speech.  My neuroexam in triage notable for diminished sensation of right hand and right lower extremity.  Stroke scale score of 1 as documented below.  Unfortunately our CT scan is currently down and we do not know when it will resume functioning.  I explained this to the patient as well as the need for a CAT scan here today given concern for CVA/TIA.  We have offered emergent transferred via EMS to [facility] in order to obtain her stroke workup however patient declined EMS transport acknowledging the risk of worsening clinical condition.  She elects to have her friend drive her directly to [facility]  We have made the [facility] ED aware of her presentation here and plan for transfer via private vehicle.  Will obtain blood work and EKG here and allow patient to be transported with her friend to the [facility] ED for further  evaluation.  NIH Stroke Scale/Score (NIHSS) from StatOfficial.[email or web address].za  on 04/30/2023 ** All calculations should be rechecked by clinician prior to use **  RESULT SUMMARY: 1 points NIH Stroke Scale   INPUTS: 1A: Level of consciousness -> 0 = Alert; keenly responsive 1B: Ask month and age -> 0 = Both questions right 1C: 'Blink eyes' & 'squeeze hands' -> 0 = Performs both tasks 2: Horizontal extraocular movements -> 0 = Normal 3: Visual fields -> 0 = No visual loss 4: Facial palsy -> 0 = Normal symmetry 5A: Left arm motor drift -> 0 = No drift for 10 seconds 5B: Right arm motor drift -> 0 = No drift for 10 seconds 6A: Left leg motor drift -> 0 = No drift for 5 seconds 6B: Right leg motor drift -> 0 = No drift for 5 seconds 7: Limb Ataxia -> 0 = No ataxia 8: Sensation -> 1 = Mild-moderate loss: less sharp/more dull  9: Language/aphasia -> 0 = Normal; no aphasia 10: Dysarthria -> 0 = Normal 11: Extinction/inattention -> 0 = No abnormality   Amount and/or Complexity of Data Reviewed Labs: ordered.           Final Clinical Impression(s) / ED Diagnoses Final diagnoses:  Paresthesia    Rx / DC Orders ED Discharge Orders     None         Royanne Foots, DO 04/30/23 1644

## 2023-05-07 ENCOUNTER — Encounter: Payer: Self-pay | Admitting: Internal Medicine

## 2023-05-07 ENCOUNTER — Ambulatory Visit: Payer: 59 | Admitting: Internal Medicine

## 2023-05-07 VITALS — BP 109/69 | HR 73 | Temp 97.9°F | Ht 67.25 in | Wt 137.0 lb

## 2023-05-07 DIAGNOSIS — I517 Cardiomegaly: Secondary | ICD-10-CM

## 2023-05-07 DIAGNOSIS — R7303 Prediabetes: Secondary | ICD-10-CM | POA: Diagnosis not present

## 2023-05-07 DIAGNOSIS — R200 Anesthesia of skin: Secondary | ICD-10-CM

## 2023-05-07 DIAGNOSIS — H43393 Other vitreous opacities, bilateral: Secondary | ICD-10-CM | POA: Diagnosis not present

## 2023-05-07 DIAGNOSIS — Z853 Personal history of malignant neoplasm of breast: Secondary | ICD-10-CM | POA: Diagnosis not present

## 2023-05-07 LAB — HEMOGLOBIN A1C: Hgb A1c MFr Bld: 5.7 % (ref 4.6–6.5)

## 2023-05-07 NOTE — Patient Instructions (Signed)
We will check the MRI and the HgA1c.

## 2023-05-07 NOTE — Progress Notes (Unsigned)
   Subjective:   Patient ID: Gabriela [name], female    DOB: 11-25-1970, 52 y.o.   MRN: [id number]  HPI The patient is a new 52 YO female coming in for several concerns. New numbness intermittent right arm and left leg. Went to urgent care and advised to go to ER and did not go.  Review of Systems  Objective:  Physical Exam  Vitals:   05/07/23 1459  BP: 109/69  Pulse: 73  Temp: 97.9 F (36.6 C)  TempSrc: Oral  SpO2: 99%  Weight: 137 lb (62.1 kg)  Height: 5' 7.25" (1.708 m)    Assessment & Plan:

## 2023-05-07 NOTE — Assessment & Plan Note (Signed)
Likely spurious no history of high blood pressure prior EKG 07/2022 with this as well. BP normal today. Will monitor.

## 2023-05-08 ENCOUNTER — Encounter: Payer: Self-pay | Admitting: Internal Medicine

## 2023-05-08 DIAGNOSIS — H43393 Other vitreous opacities, bilateral: Secondary | ICD-10-CM | POA: Insufficient documentation

## 2023-05-08 DIAGNOSIS — R200 Anesthesia of skin: Secondary | ICD-10-CM | POA: Insufficient documentation

## 2023-05-08 DIAGNOSIS — R7303 Prediabetes: Secondary | ICD-10-CM | POA: Insufficient documentation

## 2023-05-08 NOTE — Assessment & Plan Note (Signed)
She reports history of pre-diabetes with no HGA1c in last 6 months. Checking HgA1c today.

## 2023-05-08 NOTE — Assessment & Plan Note (Signed)
S/P bilateral mastectomy.

## 2023-05-08 NOTE — Assessment & Plan Note (Signed)
New floaters and she does not have an eye doctor in her network. Would prefer female and wishes [place] to do referral which is done.

## 2023-05-08 NOTE — Assessment & Plan Note (Addendum)
New numbness in right arm, right face and left leg. Now with intermittent right arm and left leg. She did go to urgent care did not get imaging. Counseled about need for MRI to detect possible aneurysm with rupture, stroke, bleeding. She is unsure about financial ability to pay for MRI at this time. Discussed if this was a stroke we would need to figure out why she had a stroke and how to prevent further which could be debilitating or fatal. She has started taking aspirin 81 mg daily since this but since we do not know if this is a stroke and if so ischemic or hemorrhagic I cannot say if this is helpful or potentially harmful. I have advised her to proceed with MRI scan and ordered that today. Checking HgA1c.

## 2023-05-09 DIAGNOSIS — F4323 Adjustment disorder with mixed anxiety and depressed mood: Secondary | ICD-10-CM | POA: Diagnosis not present

## 2023-05-15 ENCOUNTER — Encounter: Payer: Self-pay | Admitting: Obstetrics and Gynecology

## 2023-05-16 ENCOUNTER — Other Ambulatory Visit (HOSPITAL_COMMUNITY)
Admission: RE | Admit: 2023-05-16 | Discharge: 2023-05-16 | Disposition: A | Discharge status: Home / Self Care | Payer: 59 | Source: Ambulatory Visit | Attending: Obstetrics and Gynecology | Admitting: Obstetrics and Gynecology

## 2023-05-16 ENCOUNTER — Ambulatory Visit: Payer: 59 | Admitting: Obstetrics and Gynecology

## 2023-05-16 ENCOUNTER — Encounter: Payer: Self-pay | Admitting: Obstetrics and Gynecology

## 2023-05-16 VITALS — BP 108/76 | HR 57 | Temp 97.6°F

## 2023-05-16 DIAGNOSIS — N950 Postmenopausal bleeding: Secondary | ICD-10-CM | POA: Diagnosis not present

## 2023-05-16 DIAGNOSIS — N898 Other specified noninflammatory disorders of vagina: Secondary | ICD-10-CM | POA: Diagnosis not present

## 2023-05-16 DIAGNOSIS — R102 Pelvic and perineal pain: Secondary | ICD-10-CM | POA: Diagnosis not present

## 2023-05-16 DIAGNOSIS — Z124 Encounter for screening for malignant neoplasm of cervix: Secondary | ICD-10-CM

## 2023-05-16 MED ORDER — LIDOCAINE 5 % EX OINT: 1.0000 | TOPICAL_OINTMENT | Freq: Every day | CUTANEOUS | 0 refills | Status: DC | PRN

## 2023-05-16 NOTE — Telephone Encounter (Signed)
Spoke with patient. Patient reports pain and weakness has resolved. Patient reports she has chronic pelvic pain, used vaginal valium suppository 05/14/23 as previously prescribed by [facility] provider. This is not a new Rx. Spotting still present this morning. Has hemorrhoids, is sure bleeding not from them. Denies fever/chills, N/V.   Last AEX 09/07/22 ML; PUS 05/11/22  Advised OV recommended for further evaluation. Patient states she has to work, requesting late afternoon appt with next available provider. OV scheduled for today at 4pm with Dr. Karma Greaser. ER precaustions provided for new or worsening symptoms.  Routing to provider for final review. Patient is agreeable to disposition. Will close encounter.

## 2023-05-17 LAB — SURESWAB® ADVANCED VAGINITIS PLUS,TMA
C. trachomatis RNA, TMA: NOT DETECTED
CANDIDA SPECIES: NOT DETECTED
Candida glabrata: NOT DETECTED
N. gonorrhoeae RNA, TMA: NOT DETECTED
SURESWAB(R) ADV BACTERIAL VAGINOSIS(BV),TMA: NEGATIVE
TRICHOMONAS VAGINALIS (TV),TMA: NOT DETECTED

## 2023-05-18 LAB — URINALYSIS, COMPLETE W/RFL CULTURE
Bilirubin Urine: NEGATIVE
Glucose, UA: NEGATIVE
Hgb urine dipstick: NEGATIVE
Hyaline Cast: NONE SEEN /[LPF]
Leukocyte Esterase: NEGATIVE
Nitrites, Initial: NEGATIVE
Protein, ur: NEGATIVE
RBC / HPF: NONE SEEN /[HPF] (ref 0–2)
Specific Gravity, Urine: 1.02 (ref 1.001–1.035)
pH: 7 (ref 5.0–8.0)

## 2023-05-18 LAB — URINE CULTURE
MICRO NUMBER:: 15756773
SPECIMEN QUALITY:: ADEQUATE

## 2023-05-18 LAB — CULTURE INDICATED

## 2023-05-20 ENCOUNTER — Encounter: Payer: Self-pay | Admitting: Obstetrics and Gynecology

## 2023-05-21 ENCOUNTER — Encounter: Payer: Self-pay | Admitting: Obstetrics and Gynecology

## 2023-05-21 ENCOUNTER — Other Ambulatory Visit: Payer: Self-pay | Admitting: Obstetrics and Gynecology

## 2023-05-21 MED ORDER — NITROFURANTOIN MONOHYD MACRO 100 MG PO CAPS: 100.0000 mg | ORAL_CAPSULE | Freq: Two times a day (BID) | ORAL | 0 refills | Status: DC

## 2023-05-21 NOTE — Telephone Encounter (Signed)
See MyChart encounter dated 05/20/23.   Encounter closed.

## 2023-05-21 NOTE — Telephone Encounter (Signed)
Dr. Edward Jolly -please review and advise.

## 2023-05-21 NOTE — Telephone Encounter (Signed)
Patient called x3 requesting update.   Call returned to patient, left detailed message, advised messages received. Request has been routed to covering provider for review. Our office will f/u once reviewed by provider.   Routing to Dr. Marjorie Smolder

## 2023-05-21 NOTE — Progress Notes (Signed)
Rx for [employer]

## 2023-05-21 NOTE — Telephone Encounter (Signed)
Call placed to patient, left detailed message, ok per dpr. Advised message received and forwarded to Dr. Karma Greaser. Advised she is out of the office today, response may not be immediate. If no response, will review with covering provider. Return call to office if any additional questions in the meantime.   05/16/23 UA and culture completed, E-coli bacteria.

## 2023-05-22 LAB — CYTOLOGY - PAP
Comment: NEGATIVE
Diagnosis: NEGATIVE
High risk HPV: NEGATIVE

## 2023-05-23 ENCOUNTER — Encounter: Payer: Self-pay | Admitting: Obstetrics and Gynecology
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
## 2023-06-05 ENCOUNTER — Other Ambulatory Visit: Payer: 59

## 2023-06-05 ENCOUNTER — Ambulatory Visit: Payer: 59 | Admitting: Obstetrics and Gynecology

## 2023-06-06 DIAGNOSIS — F4323 Adjustment disorder with mixed anxiety and depressed mood: Secondary | ICD-10-CM | POA: Diagnosis not present

## 2023-06-29 ENCOUNTER — Ambulatory Visit: Payer: 59 | Admitting: Family

## 2023-07-04 DIAGNOSIS — F4323 Adjustment disorder with mixed anxiety and depressed mood: Secondary | ICD-10-CM | POA: Diagnosis not present

## 2023-07-16 ENCOUNTER — Ambulatory Visit
Admission: EM | Admit: 2023-07-16 | Discharge: 2023-07-16 | Disposition: A | Discharge status: Home / Self Care | Payer: 59 | Attending: Emergency Medicine | Admitting: Emergency Medicine

## 2023-07-16 DIAGNOSIS — H00011 Hordeolum externum right upper eyelid: Secondary | ICD-10-CM

## 2023-07-16 MED ORDER — DOXYCYCLINE HYCLATE 100 MG PO CAPS: 100.0000 mg | ORAL_CAPSULE | Freq: Two times a day (BID) | ORAL | 0 refills | Status: AC

## 2023-07-16 MED ORDER — BACITRACIN-POLYMYXIN B OP OINT: 1.0000 | TOPICAL_OINTMENT | Freq: Two times a day (BID) | OPHTHALMIC | 0 refills | Status: AC

## 2023-07-16 NOTE — ED Triage Notes (Signed)
Pt presents with a bump on the rt eye lid X 3 wks.   Pt states she has a headache and feels she has a fever on the rt side of her face. Has been applying bacitracin ointment, took doxycycline 50mg  that was prev prescribed to her.

## 2023-07-16 NOTE — ED Provider Notes (Signed)
[facility]    CSN: [id number] Arrival date & time: 07/16/23  1703      History   Chief Complaint Chief Complaint  Patient presents with   Eye Problem    HPI Gabriela [name] is a 53 y.o. female.  3 week history of bump on the right eyelid Reports area feels warm. Rates pain 4/10 No visual changes No fever  She called her ophthalmologist 3 days ago who refilled her doxycycline. However she did not know the medicine was filled, the pharmacy didn't notify her. She has seen them for recurrent stye in this area several times in the past She is requesting bacitracin ointment prescription today  Past Medical History:  Diagnosis Date   Anemia    "mild" (09/23/2015)   Anxiety    situational   Breast cancer of upper-inner quadrant of left female breast (HCC) 09/09/2015   Chronic left shoulder pain    Constipation    Depression    Family history of breast cancer    Hemorrhoids    IBS (irritable bowel syndrome)    IC (interstitial cystitis)    Levator syndrome    levator ani syndrome   Neuromuscular disorder (HCC)    nerve pain under left arm    Patient Active Problem List   Diagnosis Date Noted   Vitreous floaters of both eyes 05/08/2023   Numbness of leg 05/08/2023   Pre-diabetes 05/08/2023   LVH (left ventricular hypertrophy) 05/07/2023   Genetic testing 11/25/2015   HX: breast cancer 09/09/2015    Past Surgical History:  Procedure Laterality Date   BREAST SURGERY Left 2017   MASTECTOMY COMPLETE / SIMPLE Right 09/23/2015   PROPHYLACTIC    MASTECTOMY COMPLETE / SIMPLE W/ SENTINEL NODE BIOPSY Left 09/23/2015   AXILLARY SENTINEL LYMPH NODE BIOPSY   MASTECTOMY W/ SENTINEL NODE BIOPSY Bilateral 09/23/2015   Procedure: LEFT TOTAL MASTECTOMY WITH LEFT AXILLARY SENTINEL LYMPH NODE BIOPSY, RIGHT PROPHYLACTIC TOTAL MASTECTOMY;  Surgeon: Emelia Loron, MD;  Location: [facility] OR;  Service: General;  Laterality: Bilateral;   RE-EXCISION OF BREAST CANCER,SUPERIOR  MARGINS N/A 10/18/2015   Procedure: EXCISION OF SKIN FOR CLOSE MARGIN--ANTERIOR MARGIN;  Surgeon: Emelia Loron, MD;  Location: [facility];  Service: General;  Laterality: N/A;   TONSILLECTOMY     WISDOM TOOTH EXTRACTION      OB History     Gravida  2   Para  2   Term  2   Preterm      AB      Living  2      SAB      IAB      Ectopic      Multiple      Live Births               Home Medications    Prior to Admission medications   Medication Sig Start Date End Date Taking? Authorizing Provider  bacitracin-polymyxin b, ophth, (POLYSPORIN) OINT Place 1 Application into the right eye every 12 (twelve) hours. 07/16/23  Yes Laurieanne Galloway, Lurena Joiner, PA-C  doxycycline (VIBRAMYCIN) 100 MG capsule Take 1 capsule (100 mg total) by mouth 2 (two) times daily for 7 days. 07/16/23 07/23/23 Yes Evey Mcmahan, Lurena Joiner, PA-C  ALPRAZolam (XANAX) 0.5 MG tablet TAKE 1 TABLET(0.5 MG) BY MOUTH THREE TIMES DAILY AS NEEDED 11/28/22   [provider]  hydrOXYzine (ATARAX) 10 MG tablet Take 10 mg by mouth at bedtime.    [provider]  lidocaine (XYLOCAINE) 5 %  ointment Apply 1 Application topically daily as needed for mild pain (pain score 1-3). 05/16/23   Earley Favor, MD  nitrofurantoin, macrocrystal-monohydrate, (MACROBID) 100 MG capsule Take 1 capsule (100 mg total) by mouth 2 (two) times daily. Take twice daily for 5 days. 05/21/23   Patton Salles, MD  VITAMIN D PO Take 5,000 Int'l Units by mouth.    [provider]    Family History Family History  Problem Relation Age of Onset   Breast cancer Mother 7   Breast cancer Maternal Grandmother 57   Congestive Heart Failure Maternal Grandmother    Lung cancer Maternal Grandfather     Social History Social History   Tobacco Use   Smoking status: Never   Smokeless tobacco: Never  Substance Use Topics   Alcohol use: Not Currently   Drug use: No     Allergies   Sulfa  antibiotics   Review of Systems Review of Systems Per HPI  Physical Exam Triage Vital Signs ED Triage Vitals  Encounter Vitals Group     BP 07/16/23 1713 107/67     Systolic BP Percentile --      Diastolic BP Percentile --      Pulse Rate 07/16/23 1713 63     Resp 07/16/23 1713 18     Temp 07/16/23 1713 97.8 F (36.6 C)     Temp Source 07/16/23 1713 Oral     SpO2 07/16/23 1713 98 %     Weight --      Height --      Head Circumference --      Peak Flow --      Pain Score 07/16/23 1712 4     Pain Loc --      Pain Education --      Exclude from Growth Chart --    No data found.  Updated Vital Signs BP 107/67 (BP Location: Right Arm)   Pulse 63   Temp 97.8 F (36.6 C) (Oral)   Resp 18   LMP 10/14/2015 Comment: not sexually active  SpO2 98%   Physical Exam Vitals and nursing note reviewed.  Constitutional:      General: She is not in acute distress.    Appearance: She is not ill-appearing.  HENT:     Mouth/Throat:     Mouth: Mucous membranes are moist.     Pharynx: Oropharynx is clear. No posterior oropharyngeal erythema.  Eyes:     General: Lids are everted, no foreign bodies appreciated. Vision grossly intact. Gaze aligned appropriately.        Right eye: Hordeolum present. No discharge.     Extraocular Movements: Extraocular movements intact.     Conjunctiva/sclera: Conjunctivae normal.     Right eye: Right conjunctiva is not injected.     Pupils: Pupils are equal, round, and reactive to light.     Comments: External hordeolum on right upper lid. Mildly erythematous  Cardiovascular:     Rate and Rhythm: Normal rate and regular rhythm.     Heart sounds: Normal heart sounds.  Pulmonary:     Effort: Pulmonary effort is normal.     Breath sounds: Normal breath sounds.  Skin:    General: Skin is warm and dry.  Neurological:     Mental Status: She is alert and oriented to person, place, and time.     UC Treatments / Results  Labs (all labs ordered are  listed, but only abnormal results are displayed) Labs  Reviewed - No data to display  EKG   Radiology No results found.  Procedures Procedures (including critical care time)  Medications Ordered in UC Medications - No data to display  Initial Impression / Assessment and Plan / UC Course  I have reviewed the triage vital signs and the nursing notes.  Pertinent labs & imaging results that were available during my care of the patient were reviewed by me and considered in my medical decision making (see chart for details).  External hordeolum No concern for preseptal cellulitis or other skin infection at this time. However her eye specialist attempted to call in the doxycycline and it was not transmitted per chart review. Out of courtesy will go ahead and send this in for her, although I did discuss with her she does not appear to need an antibiotic at this time. She is also requesting bacitracin ointment; sent polysporin. Discussed she needs to call eye doc in the morning and update them, and make appointment for follow up in person.   Patient was requesting a steroid injection in the eyelid. We discussed this is not appropriate treatment and not performed in urgent care. Needs to contact eye specialist.   Final Clinical Impressions(s) / UC Diagnoses   Final diagnoses:  Hordeolum externum of right upper eyelid     Discharge Instructions      Ointment twice daily to the eyelid I have sent the doxycycline to your pharmacy Always take with food  Call your eye doctor first thing tomorrow morning      ED Prescriptions     Medication Sig Dispense Auth. Provider   bacitracin-polymyxin b, ophth, (POLYSPORIN) OINT Place 1 Application into the right eye every 12 (twelve) hours. 3.5 g Malin Sambrano, PA-C   doxycycline (VIBRAMYCIN) 100 MG capsule Take 1 capsule (100 mg total) by mouth 2 (two) times daily for 7 days. 14 capsule Ramces Shomaker, Lurena Joiner, PA-C      PDMP not reviewed this  encounter.   Lauramae Kneisley, Lurena Joiner, [place] 07/16/23 1820

## 2023-07-16 NOTE — Discharge Instructions (Addendum)
Ointment twice daily to the eyelid I have sent the doxycycline to your pharmacy Always take with food  Call your eye doctor first thing tomorrow morning

## 2023-08-01 DIAGNOSIS — F4323 Adjustment disorder with mixed anxiety and depressed mood: Secondary | ICD-10-CM | POA: Diagnosis not present

## 2023-08-13 DIAGNOSIS — L739 Follicular disorder, unspecified: Secondary | ICD-10-CM | POA: Diagnosis not present

## 2023-08-13 DIAGNOSIS — H2513 Age-related nuclear cataract, bilateral: Secondary | ICD-10-CM | POA: Diagnosis not present

## 2023-08-13 DIAGNOSIS — H40003 Preglaucoma, unspecified, bilateral: Secondary | ICD-10-CM | POA: Diagnosis not present

## 2023-08-13 DIAGNOSIS — H43813 Vitreous degeneration, bilateral: Secondary | ICD-10-CM | POA: Diagnosis not present

## 2023-08-27 ENCOUNTER — Telehealth: Payer: Self-pay

## 2023-08-27 NOTE — Telephone Encounter (Signed)
 Pt LVM in triage line stating that she is having some abdominal pain and wanted to see if she could be worked in for an appt.   Per EMR investigation, pt is scheduled for an appt on 3/4. Will route to encounter for final review and close.

## 2023-08-28 ENCOUNTER — Ambulatory Visit: Admitting: Obstetrics and Gynecology
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
## 2023-09-06 ENCOUNTER — Other Ambulatory Visit: Payer: Self-pay | Admitting: Obstetrics and Gynecology

## 2023-09-06 DIAGNOSIS — R102 Pelvic and perineal pain: Secondary | ICD-10-CM

## 2023-09-06 DIAGNOSIS — N950 Postmenopausal bleeding: Secondary | ICD-10-CM

## 2023-09-07 DIAGNOSIS — H0011 Chalazion right upper eyelid: Secondary | ICD-10-CM | POA: Diagnosis not present

## 2023-09-11 ENCOUNTER — Encounter: Payer: Self-pay | Admitting: Obstetrics and Gynecology

## 2023-09-11 ENCOUNTER — Ambulatory Visit (INDEPENDENT_AMBULATORY_CARE_PROVIDER_SITE_OTHER): Admitting: Obstetrics and Gynecology

## 2023-09-11 ENCOUNTER — Ambulatory Visit: Payer: PRIVATE HEALTH INSURANCE | Admitting: Obstetrics and Gynecology

## 2023-09-11 ENCOUNTER — Ambulatory Visit (INDEPENDENT_AMBULATORY_CARE_PROVIDER_SITE_OTHER): Payer: PRIVATE HEALTH INSURANCE

## 2023-09-11 VITALS — BP 126/74 | HR 64 | Wt 137.0 lb

## 2023-09-11 DIAGNOSIS — R102 Pelvic and perineal pain: Secondary | ICD-10-CM

## 2023-09-11 DIAGNOSIS — N950 Postmenopausal bleeding: Secondary | ICD-10-CM

## 2023-09-11 DIAGNOSIS — R1900 Intra-abdominal and pelvic swelling, mass and lump, unspecified site: Secondary | ICD-10-CM

## 2023-09-11 DIAGNOSIS — R9389 Abnormal findings on diagnostic imaging of other specified body structures: Secondary | ICD-10-CM | POA: Diagnosis not present

## 2023-09-11 LAB — URINALYSIS, COMPLETE W/RFL CULTURE
Bacteria, UA: NONE SEEN /HPF
Bilirubin Urine: NEGATIVE
Glucose, UA: NEGATIVE
Hgb urine dipstick: NEGATIVE
Hyaline Cast: NONE SEEN /LPF
Ketones, ur: NEGATIVE
Leukocyte Esterase: NEGATIVE
Nitrites, Initial: NEGATIVE
Protein, ur: NEGATIVE
RBC / HPF: NONE SEEN /HPF (ref 0–2)
Specific Gravity, Urine: 1.025 (ref 1.001–1.035)
WBC, UA: NONE SEEN /HPF (ref 0–5)
pH: 5.5 (ref 5.0–8.0)

## 2023-09-11 LAB — NO CULTURE INDICATED

## 2023-09-12 ENCOUNTER — Encounter: Payer: Self-pay | Admitting: Obstetrics and Gynecology

## 2023-09-28 ENCOUNTER — Ambulatory Visit: Payer: PRIVATE HEALTH INSURANCE | Admitting: Internal Medicine

## 2023-10-16 ENCOUNTER — Telehealth: Payer: Self-pay

## 2023-10-16 NOTE — Transitions of Care (Post Inpatient/ED Visit) (Signed)
   10/16/2023  Name: Gabriela [name] MRN: [id number] DOB: Jan 06, 1971  Today's TOC FU Call Status:    Attempted to reach the patient regarding the most recent Inpatient/ED visit.  Follow Up Plan: Additional outreach attempts will be made to reach the patient to complete the Transitions of Care (Post Inpatient/ED visit) call.   Signature Jaylon Boylen,CMA

## 2023-10-24 ENCOUNTER — Other Ambulatory Visit

## 2023-10-24 ENCOUNTER — Other Ambulatory Visit: Admitting: Obstetrics and Gynecology

## 2023-10-26 DIAGNOSIS — R102 Pelvic and perineal pain: Secondary | ICD-10-CM | POA: Diagnosis not present

## 2023-10-26 DIAGNOSIS — N950 Postmenopausal bleeding: Secondary | ICD-10-CM | POA: Diagnosis not present

## 2023-10-30 ENCOUNTER — Ambulatory Visit (INDEPENDENT_AMBULATORY_CARE_PROVIDER_SITE_OTHER): Admitting: Internal Medicine

## 2023-10-30 ENCOUNTER — Encounter: Payer: Self-pay | Admitting: Internal Medicine

## 2023-10-30 VITALS — BP 118/82 | HR 55 | Temp 98.2°F | Ht 67.25 in | Wt 138.0 lb

## 2023-10-30 DIAGNOSIS — L03039 Cellulitis of unspecified toe: Secondary | ICD-10-CM | POA: Diagnosis not present

## 2023-10-30 DIAGNOSIS — Z853 Personal history of malignant neoplasm of breast: Secondary | ICD-10-CM | POA: Diagnosis not present

## 2023-10-30 DIAGNOSIS — K5904 Chronic idiopathic constipation: Secondary | ICD-10-CM | POA: Diagnosis not present

## 2023-10-30 DIAGNOSIS — R7303 Prediabetes: Secondary | ICD-10-CM | POA: Diagnosis not present

## 2023-10-30 DIAGNOSIS — R200 Anesthesia of skin: Secondary | ICD-10-CM | POA: Diagnosis not present

## 2023-10-30 DIAGNOSIS — Z0000 Encounter for general adult medical examination without abnormal findings: Secondary | ICD-10-CM | POA: Diagnosis not present

## 2023-10-30 DIAGNOSIS — Z0001 Encounter for general adult medical examination with abnormal findings: Secondary | ICD-10-CM

## 2023-10-30 MED ORDER — LUBIPROSTONE 8 MCG PO CAPS
8.0000 ug | ORAL_CAPSULE | Freq: Two times a day (BID) | ORAL | 3 refills | Status: AC
Start: 2023-10-30 — End: ?

## 2023-10-30 MED ORDER — ESCITALOPRAM OXALATE 10 MG PO TABS: 10.0000 mg | ORAL_TABLET | Freq: Every day | ORAL | 3 refills | Status: DC

## 2023-10-30 MED ORDER — CEPHALEXIN 500 MG PO CAPS: 500.0000 mg | ORAL_CAPSULE | Freq: Two times a day (BID) | ORAL | 0 refills | Status: AC

## 2023-10-30 NOTE — Progress Notes (Unsigned)
   Subjective:   Patient ID: Gabriela [name], female    DOB: [birth year], 53 y.o.   MRN: [id number]  HPI The patient is here for physical. Does have new pain in toe.  PMH, [facility], social history reviewed and updated  Review of Systems  Constitutional: Negative.   HENT: Negative.    Eyes: Negative.   Respiratory:  Negative for cough, chest tightness and shortness of breath.   Cardiovascular:  Negative for chest pain, palpitations and leg swelling.  Gastrointestinal:  Negative for abdominal distention, abdominal pain, constipation, diarrhea, nausea and vomiting.  Musculoskeletal: Negative.   Skin: Negative.   Neurological: Negative.   Psychiatric/Behavioral: Negative.      Objective:  Physical Exam Constitutional:      Appearance: She is well-developed.  HENT:     Head: Normocephalic and atraumatic.  Cardiovascular:     Rate and Rhythm: Normal rate and regular rhythm.  Pulmonary:     Effort: Pulmonary effort is normal. No respiratory distress.     Breath sounds: Normal breath sounds. No wheezing or rales.  Abdominal:     General: Bowel sounds are normal. There is no distension.     Palpations: Abdomen is soft.     Tenderness: There is no abdominal tenderness. There is no rebound.  Musculoskeletal:     Cervical back: Normal range of motion.  Skin:    General: Skin is warm and dry.  Neurological:     Mental Status: She is alert and oriented to person, place, and time.     Coordination: Coordination normal.     Vitals:   10/30/23 1557  BP: 118/82  Pulse: (!) 55  Temp: 98.2 F (36.8 C)  TempSrc: Oral  SpO2: 98%  Weight: 138 lb (62.6 kg)  Height: 5' 7.25" (1.708 m)    Assessment & Plan:

## 2023-10-30 NOTE — Patient Instructions (Addendum)
 We have sent in the keflex 1 pill twice a day for 5 days.  We have sent in the amitiza to take for constipation.

## 2023-10-31 DIAGNOSIS — K5900 Constipation, unspecified: Secondary | ICD-10-CM | POA: Insufficient documentation

## 2023-10-31 DIAGNOSIS — L03039 Cellulitis of unspecified toe: Secondary | ICD-10-CM | POA: Insufficient documentation

## 2023-10-31 DIAGNOSIS — Z0001 Encounter for general adult medical examination with abnormal findings: Secondary | ICD-10-CM | POA: Insufficient documentation

## 2023-10-31 LAB — LIPID PANEL
Cholesterol: 217 mg/dL — ABNORMAL HIGH (ref 0–200)
HDL: 40.6 mg/dL (ref >39.00)
LDL Cholesterol: 148 mg/dL — ABNORMAL HIGH (ref 0–99)
NonHDL: 176.16
Total CHOL/HDL Ratio: 5
Triglycerides: 143 mg/dL (ref 0.0–149.0)
VLDL: 28.6 mg/dL (ref 0.0–40.0)

## 2023-10-31 LAB — COMPREHENSIVE METABOLIC PANEL WITH GFR
ALT: 9 U/L (ref 0–35)
AST: 18 U/L (ref 0–37)
Albumin: 4.7 g/dL (ref 3.5–5.2)
Alkaline Phosphatase: 76 U/L (ref 39–117)
BUN: 22 mg/dL (ref 6–23)
CO2: 30 meq/L (ref 19–32)
Calcium: 9.7 mg/dL (ref 8.4–10.5)
Chloride: 103 meq/L (ref 96–112)
Creatinine, Ser: 0.85 mg/dL (ref 0.40–1.20)
GFR: 77.76 mL/min (ref >60.00)
Glucose, Bld: 90 mg/dL (ref 70–99)
Potassium: 4.1 meq/L (ref 3.5–5.1)
Sodium: 141 meq/L (ref 135–145)
Total Bilirubin: 0.4 mg/dL (ref 0.2–1.2)
Total Protein: 7.2 g/dL (ref 6.0–8.3)

## 2023-10-31 LAB — CBC
HCT: 40.5 % (ref 36.0–46.0)
Hemoglobin: 13.4 g/dL (ref 12.0–15.0)
MCHC: 33.1 g/dL (ref 30.0–36.0)
MCV: 90 fl (ref 78.0–100.0)
Platelets: 284 10*3/uL (ref 150.0–400.0)
RBC: 4.49 Mil/uL (ref 3.87–5.11)
RDW: 13.9 % (ref 11.5–15.5)
WBC: 7 10*3/uL (ref 4.0–10.5)

## 2023-10-31 LAB — HEMOGLOBIN A1C: Hgb A1c MFr Bld: 5.8 % (ref 4.6–6.5)

## 2023-10-31 NOTE — Assessment & Plan Note (Signed)
 Right great toenail with infection after trimming. Rx keflex 500 mg BID for 5 days. This is gradually improving over last few days so okay if she wants to wait to see if this self resolves and activate keflex if no improvement.

## 2023-10-31 NOTE — Assessment & Plan Note (Signed)
 Continues yearly monitoring.

## 2023-10-31 NOTE — Assessment & Plan Note (Signed)
 Has had some improvement in this symptom. Rx lexapro to help with this atypical pain 10 mg daily.

## 2023-10-31 NOTE — Assessment & Plan Note (Signed)
 Previously took [place] daily and unsure dose. Rx 8 mcg BID prn and she will let us  know if she desires to move to 24 mcg daily dosing.

## 2023-10-31 NOTE — Assessment & Plan Note (Signed)
 Flu shot yearly. Shingrix counseled. Tetanus up to date. Colonoscopy counseled. Mammogram counseled, pap smear up to date. Counseled about sun safety and mole surveillance. Counseled about the dangers of distracted driving. Given 10 year screening recommendations.

## 2023-10-31 NOTE — Assessment & Plan Note (Signed)
 Checking HGA1c and adjust as needed.

## 2023-11-05 ENCOUNTER — Encounter: Payer: Self-pay | Admitting: Internal Medicine

## 2023-11-07 ENCOUNTER — Ambulatory Visit: Payer: Self-pay

## 2023-11-22 ENCOUNTER — Ambulatory Visit: Payer: Self-pay | Admitting: Nurse Practitioner

## 2023-11-22 ENCOUNTER — Ambulatory Visit (INDEPENDENT_AMBULATORY_CARE_PROVIDER_SITE_OTHER): Admitting: Nurse Practitioner

## 2023-11-22 ENCOUNTER — Encounter: Payer: Self-pay | Admitting: Nurse Practitioner

## 2023-11-22 VITALS — BP 114/76 | HR 57

## 2023-11-22 DIAGNOSIS — N898 Other specified noninflammatory disorders of vagina: Secondary | ICD-10-CM | POA: Diagnosis not present

## 2023-11-22 DIAGNOSIS — R102 Pelvic and perineal pain: Secondary | ICD-10-CM | POA: Diagnosis not present

## 2023-11-22 LAB — URINALYSIS, COMPLETE W/RFL CULTURE
Bacteria, UA: NONE SEEN /HPF
Glucose, UA: NEGATIVE
Hgb urine dipstick: NEGATIVE
Hyaline Cast: NONE SEEN /LPF
Leukocyte Esterase: NEGATIVE
Nitrites, Initial: NEGATIVE
Protein, ur: NEGATIVE
RBC / HPF: NONE SEEN /HPF (ref 0–2)
Specific Gravity, Urine: 1.02 (ref 1.001–1.035)
WBC, UA: NONE SEEN /HPF (ref 0–5)
pH: 6 (ref 5.0–8.0)

## 2023-11-22 LAB — WET PREP FOR TRICH, YEAST, CLUE

## 2023-11-22 LAB — NO CULTURE INDICATED

## 2023-11-22 NOTE — Progress Notes (Signed)
   Acute Office Visit  Subjective:    Patient ID: Gabriela [name], female    DOB: 27-Dec-1970, 53 y.o.   MRN: [id number]   HPI 53 y.o. presents today for yellow discharge, urinary incontinence, and pressure 1.5 weeks ago. Felt  like something was bulging or prolapsing into vagina for a couple of days. Most of these symptoms have resolved. US  09/11/23 for pelvic pain - endometrium 4.61 with some thickening and possible feeder vessel. Pelvic floor PT, sonohysterogram, EMB recommended by Dr. Colvin Dec in March. Has done PT with no relief. Thought to have hypertonus. Unable to be sexual active due to pain. Saw OBGYN at [facility] for PMB, scheduled for hysteroscopy in July. H/O IBS-C, chronic pelvic/abdominal pain, breast cancer. Describes chronic pain a starting in right glute and extending to pelvis and even into mid abdomen. Feels it is nerve-related. IC managed by urology, has prescription for vaginal valium but had BV after use so she is hesitant to use again. Has had 2 vaginal deliveries. Has had neg STD screening, declines today.   Patient's last menstrual period was 10/14/2015.    Review of Systems  Constitutional: Negative.   Genitourinary:  Positive for pelvic pain and vaginal discharge. Negative for difficulty urinating, dysuria, flank pain, frequency, genital sores, hematuria, vaginal bleeding and vaginal pain.       Objective:     Physical Exam Constitutional:      Appearance: Normal appearance.  Genitourinary:    Vagina: Normal.     Cervix: Normal.     Uterus: Normal.      Adnexa: Right adnexa normal and left adnexa normal.     BP 114/76 (BP Location: Right Arm, Patient Position: Sitting)   Pulse (!) 57   LMP 10/14/2015 Comment: not sexually active  SpO2 99%  Wt Readings from Last 3 Encounters:  10/30/23 138 lb (62.6 kg)  09/11/23 137 lb (62.1 kg)  05/07/23 137 lb (62.1 kg)        Wet prep negative for pathogens UA negative  Assessment & Plan:   Problem List Items  Addressed This Visit   None Visit Diagnoses       Vaginal discharge    -  Primary   Relevant Orders   WET PREP FOR TRICH, YEAST, CLUE     Pelvic pain       Relevant Orders   Urinalysis,Complete w/RFL Culture      Plan: Negative wet prep and exam. No evidence of bladder, rectal or uterine prolapse today. We discussed prolapse S/S, causes and management.   Return if symptoms worsen or fail to improve.    Andee Bamberger DNP, 10:52 AM 11/22/2023

## 2024-01-04 DIAGNOSIS — N950 Postmenopausal bleeding: Secondary | ICD-10-CM | POA: Diagnosis not present

## 2024-01-11 DIAGNOSIS — N950 Postmenopausal bleeding: Secondary | ICD-10-CM | POA: Diagnosis not present

## 2024-01-11 DIAGNOSIS — N840 Polyp of corpus uteri: Secondary | ICD-10-CM | POA: Diagnosis not present

## 2024-01-18 DIAGNOSIS — R102 Pelvic and perineal pain: Secondary | ICD-10-CM | POA: Diagnosis not present

## 2024-01-26 DIAGNOSIS — R102 Pelvic and perineal pain: Secondary | ICD-10-CM | POA: Diagnosis not present

## 2024-01-26 DIAGNOSIS — N854 Malposition of uterus: Secondary | ICD-10-CM | POA: Diagnosis not present

## 2024-02-01 DIAGNOSIS — R399 Unspecified symptoms and signs involving the genitourinary system: Secondary | ICD-10-CM | POA: Diagnosis not present

## 2024-02-01 DIAGNOSIS — M62838 Other muscle spasm: Secondary | ICD-10-CM | POA: Diagnosis not present

## 2024-02-07 DIAGNOSIS — Z7189 Other specified counseling: Secondary | ICD-10-CM | POA: Diagnosis not present

## 2024-02-07 DIAGNOSIS — R102 Pelvic and perineal pain: Secondary | ICD-10-CM | POA: Diagnosis not present

## 2024-02-14 DIAGNOSIS — R102 Pelvic and perineal pain: Secondary | ICD-10-CM | POA: Diagnosis not present

## 2024-03-07 ENCOUNTER — Encounter: Payer: Self-pay | Admitting: Internal Medicine

## 2024-03-07 ENCOUNTER — Other Ambulatory Visit (HOSPITAL_COMMUNITY): Payer: Self-pay

## 2024-03-07 ENCOUNTER — Ambulatory Visit (INDEPENDENT_AMBULATORY_CARE_PROVIDER_SITE_OTHER): Admitting: Internal Medicine

## 2024-03-07 VITALS — BP 100/60 | HR 81 | Temp 98.2°F | Ht 67.25 in | Wt 139.0 lb

## 2024-03-07 DIAGNOSIS — R102 Pelvic and perineal pain: Secondary | ICD-10-CM

## 2024-03-07 DIAGNOSIS — R7303 Prediabetes: Secondary | ICD-10-CM | POA: Diagnosis not present

## 2024-03-07 DIAGNOSIS — G8929 Other chronic pain: Secondary | ICD-10-CM | POA: Diagnosis not present

## 2024-03-07 DIAGNOSIS — Z23 Encounter for immunization: Secondary | ICD-10-CM | POA: Diagnosis not present

## 2024-03-07 DIAGNOSIS — E782 Mixed hyperlipidemia: Secondary | ICD-10-CM | POA: Diagnosis not present

## 2024-03-07 LAB — LIPID PANEL
Cholesterol: 174 mg/dL (ref 0–200)
HDL: 46.6 mg/dL (ref >39.00)
LDL Cholesterol: 112 mg/dL — ABNORMAL HIGH (ref 0–99)
NonHDL: 127.81
Total CHOL/HDL Ratio: 4
Triglycerides: 80 mg/dL (ref 0.0–149.0)
VLDL: 16 mg/dL (ref 0.0–40.0)

## 2024-03-07 LAB — HEMOGLOBIN A1C: Hgb A1c MFr Bld: 6.1 % (ref 4.6–6.5)

## 2024-03-07 LAB — SEDIMENTATION RATE: Sed Rate: 5 mm/h (ref 0–30)

## 2024-03-07 LAB — C-REACTIVE PROTEIN: CRP: <1 mg/dL (ref 0.5–20.0)

## 2024-03-07 LAB — VITAMIN B12: Vitamin B-12: 648 pg/mL (ref 211–911)

## 2024-03-07 LAB — VITAMIN D 25 HYDROXY (VIT D DEFICIENCY, FRACTURES): VITD: 59.12 ng/mL (ref 30.00–100.00)

## 2024-03-07 MED ORDER — ESCITALOPRAM OXALATE 5 MG PO TABS
5.0000 mg | ORAL_TABLET | Freq: Every day | ORAL | 3 refills | Status: AC
  Filled 2024-03-07 – 2024-05-07 (×2): qty 90, 90d supply, fill #0

## 2024-03-07 NOTE — Assessment & Plan Note (Signed)
 A pain management specialist was consulted. She did not tolerate ketamine nasal spray. Hormone levels and inflammatory markers are suggested for evaluation. Order CRP and ESR tests as well as estrogen, progesterone and testosterone and ds DNA and ANA. We discussed that I am unsure she has symptoms of an auto-immune disease.

## 2024-03-07 NOTE — Patient Instructions (Signed)
We will check the labs.   

## 2024-03-07 NOTE — Progress Notes (Signed)
 Subjective:   Patient ID: Gabriela [name], female    DOB: [birth year], 53 y.o.   MRN: [id number]  Discussed the use of AI scribe software for clinical note transcription with the patient, who gave verbal consent to proceed.  History of Present Illness Gabriela [name] is a 53 year old female who presents for evaluation of her A1c and cholesterol levels.  She is focused on improving her A1c and cholesterol levels through exercise and lifestyle changes, including hiring a Systems analyst. She follows a vegetarian diet but struggles with a 'sugar addiction' and comfort eating, particularly in the evenings. She is interested in understanding the impact of these changes on her health since her last check-up.  She experiences hip pain, which she believes may be joint-related. She inquires about the use of glucosamine and turmeric for joint health, as she has heard they may help with inflammation and joint progression.  She has a history of breast cancer and cannot take estrogen or progesterone. She reports that a pain management specialist at [facility] discussed the possibility of testosterone therapy for chronic pelvic pain, given her history of breast cancer and inability to take estrogen or progesterone. She has tried ketamine nasal spray for pain management but found it caused brain fog and was not a long-term solution. She is considering further lab work to explore potential inflammatory markers and hormone levels related to her pelvic pain.  She is currently taking Lexapro  but notes that the prescribed dose was higher than her previous dose, so she has been halving the pills. She requests a prescription for the correct dosage to avoid splitting the pills.  She is concerned about her vitamin D  and B12 levels, as she has been deficient in the past. She currently takes 5000 IU of vitamin D  daily and is unsure if this is appropriate. She also inquires about the need for a bone density test,  given her risk factors such as being tall and thin.  Review of Systems  Constitutional: Negative.   HENT: Negative.    Eyes: Negative.   Respiratory:  Negative for cough, chest tightness and shortness of breath.   Cardiovascular:  Negative for chest pain, palpitations and leg swelling.  Gastrointestinal:  Negative for abdominal distention, abdominal pain, constipation, diarrhea, nausea and vomiting.  Genitourinary:  Positive for pelvic pain.  Musculoskeletal: Negative.   Skin: Negative.   Neurological: Negative.   Psychiatric/Behavioral: Negative.      Objective:  Physical Exam Constitutional:      Appearance: She is well-developed.  HENT:     Head: Normocephalic and atraumatic.  Cardiovascular:     Rate and Rhythm: Normal rate and regular rhythm.  Pulmonary:     Effort: Pulmonary effort is normal. No respiratory distress.     Breath sounds: Normal breath sounds. No wheezing or rales.  Abdominal:     General: Bowel sounds are normal. There is no distension.     Palpations: Abdomen is soft.     Tenderness: There is no abdominal tenderness.  Musculoskeletal:     Cervical back: Normal range of motion.  Skin:    General: Skin is warm and dry.  Neurological:     Mental Status: She is alert and oriented to person, place, and time.     Coordination: Coordination normal.     Vitals:   03/07/24 0921  BP: 100/60  Pulse: 81  Temp: 98.2 F (36.8 C)  TempSrc: Oral  SpO2: 99%  Weight: 139 lb (63 kg)  Height: 5' 7.25 (1.708 m)    Assessment and Plan Assessment & Plan Prediabetes   She is actively managing prediabetes through lifestyle modifications. There is no strong evidence supporting cinnamon or supplements for blood sugar management. Order an A1c test to assess current blood sugar control.  Mixed hyperlipidemia   She is concerned about cholesterol levels. Niacin is not recommended. Order a cholesterol test to reassess lipid levels.  Chronic pelvic pain   A pain  management specialist was consulted. She did not tolerate ketamine nasal spray. Hormone levels and inflammatory markers are suggested for evaluation. Order CRP and ESR tests as well as estrogen, progesterone and testosterone and ds DNA and ANA. We discussed that I am unsure she has symptoms of an auto-immune disease.   Depression   She is on Lexapro  with a dosage discrepancy due to her not knowing her dose last visit. Prescribe Lexapro  5 mg with refills and send the prescription to the community pharmacy at [facility]   She received a flu shot.

## 2024-03-07 NOTE — Assessment & Plan Note (Signed)
 She is actively managing prediabetes through lifestyle modifications. There is no strong evidence supporting cinnamon or supplements for blood sugar management. Order an A1c test to assess current blood sugar control.

## 2024-03-07 NOTE — Assessment & Plan Note (Signed)
 She is concerned about cholesterol levels. Niacin is not recommended. Order a cholesterol test to reassess lipid levels.

## 2024-03-11 ENCOUNTER — Ambulatory Visit: Payer: Self-pay | Admitting: Internal Medicine

## 2024-03-12 LAB — ESTROGENS, TOTAL: Estrogen: 33 pg/mL

## 2024-03-12 LAB — ANTI-DNA ANTIBODY, DOUBLE-STRANDED: ds DNA Ab: 2 [IU]/mL

## 2024-03-12 LAB — PROGESTERONE: Progesterone: <0.5 ng/mL

## 2024-03-13 LAB — ANA W/REFLEX: Anti Nuclear Antibody (ANA): NEGATIVE

## 2024-03-13 LAB — TESTOSTERONE,FREE AND TOTAL
Testosterone, Free: 0.3 pg/mL (ref 0.0–4.2)
Testosterone: 14 ng/dL (ref 4–50)

## 2024-03-18 ENCOUNTER — Other Ambulatory Visit (HOSPITAL_COMMUNITY): Payer: Self-pay

## 2024-03-19 NOTE — Progress Notes (Signed)
 Called patient back and there were no questions or concerns

## 2024-05-07 ENCOUNTER — Other Ambulatory Visit (HOSPITAL_COMMUNITY): Payer: Self-pay

## 2024-05-31 ENCOUNTER — Telehealth

## 2024-06-02 ENCOUNTER — Telehealth: Admitting: Family Medicine

## 2024-06-02 ENCOUNTER — Encounter: Payer: Self-pay | Admitting: Internal Medicine

## 2024-06-02 ENCOUNTER — Ambulatory Visit: Payer: Self-pay

## 2024-06-02 DIAGNOSIS — J019 Acute sinusitis, unspecified: Secondary | ICD-10-CM

## 2024-06-02 DIAGNOSIS — B9689 Other specified bacterial agents as the cause of diseases classified elsewhere: Secondary | ICD-10-CM

## 2024-06-02 DIAGNOSIS — J40 Bronchitis, not specified as acute or chronic: Secondary | ICD-10-CM | POA: Diagnosis not present

## 2024-06-02 MED ORDER — AZITHROMYCIN 250 MG PO TABS: ORAL_TABLET | ORAL | 0 refills | Status: AC

## 2024-06-02 MED ORDER — PROMETHAZINE-DM 6.25-15 MG/5ML PO SYRP: 5.0000 mL | ORAL_SOLUTION | Freq: Four times a day (QID) | ORAL | 0 refills | Status: AC | PRN

## 2024-06-02 NOTE — Telephone Encounter (Signed)
 FYI Only or Action Required?: FYI only for provider: appointment scheduled on 12.8.25 virtual UC.  Patient was last seen in primary care on 03/07/2024 by Rollene Almarie LABOR, MD.  Called Nurse Triage reporting Cough.  Symptoms began several days ago.  Interventions attempted: OTC medications: mucinex, pearls.  Symptoms are: gradually worsening.  Triage Disposition: See Physician Within 24 Hours  Patient/caregiver understands and will follow disposition?: Yes    Copied from CRM #[id number]. Topic: Clinical - Red Word Triage >> Jun 02, 2024  2:59 PM Eva FALCON wrote: Red Word that prompted transfer to Nurse Triage: aches, pain, congestion, cough severe, unable to sleep due to non stop cough. Reason for Disposition  [1] Continuous (nonstop) coughing interferes with work or school AND [2] no improvement using cough treatment per Care Advice  Answer Assessment - Initial Assessment Questions Cold a month ago, got better. States it came back on Saturday but worse. Having coughing fits that makes it hard to sleep, body aches, feels run down, had a sore throat but doesn't any longer. Pt denies any shortness of breath or chest pain. Cough is slightly productive. Low grade fever, nasal congestion. She states typically she needs hydromet to get rid of her coughs and thinks she may need atbx.   1. ONSET: When did the cough begin?      This time Saturday 2. SEVERITY: How bad is the cough today?      moderate 3. SPUTUM: Describe the color of your sputum (e.g., none, dry cough; clear, white, yellow, green)     Light beige, green 4. HEMOPTYSIS: Are you coughing up any blood? If Yes, ask: How much? (e.g., flecks, streaks, tablespoons, etc.)     denies 5. DIFFICULTY BREATHING: Are you having difficulty breathing? If Yes, ask: How bad is it? (e.g., mild, moderate, severe)      denies 6. FEVER: Do you have a fever? If Yes, ask: What is your temperature, how was it measured, and when did  it start?     Low grade about 100 7. CARDIAC HISTORY: Do you have any history of heart disease? (e.g., heart attack, congestive heart failure)      lvh 8. LUNG HISTORY: Do you have any history of lung disease?  (e.g., pulmonary embolus, asthma, emphysema)     no 10. OTHER SYMPTOMS: Do you have any other symptoms? (e.g., runny nose, wheezing, chest pain)       denies  Protocols used: Cough - Acute Productive-A-AH

## 2024-06-02 NOTE — Patient Instructions (Signed)

## 2024-06-02 NOTE — Progress Notes (Signed)
 Virtual Visit Consent   Gabriela [name], you are scheduled for a virtual visit with a [facility] provider today. Just as with appointments in the office, your consent must be obtained to participate. Your consent will be active for this visit and any virtual visit you may have with one of our providers in the next 365 days. If you have a MyChart account, a copy of this consent can be sent to you electronically.  As this is a virtual visit, video technology does not allow for your provider to perform a traditional examination. This may limit your provider's ability to fully assess your condition. If your provider identifies any concerns that need to be evaluated in person or the need to arrange testing (such as labs, EKG, etc.), we will make arrangements to do so. Although advances in technology are sophisticated, we cannot ensure that it will always work on either your end or our end. If the connection with a video visit is poor, the visit may have to be switched to a telephone visit. With either a video or telephone visit, we are not always able to ensure that we have a secure connection.  By engaging in this virtual visit, you consent to the provision of healthcare and authorize for your insurance to be billed (if applicable) for the services provided during this visit. Depending on your insurance coverage, you may receive a charge related to this service.  I need to obtain your verbal consent now. Are you willing to proceed with your visit today? Gabriela [name] has provided verbal consent on 06/02/2024 for a virtual visit (video or telephone). Loa Lamp, FNP  Date: 06/02/2024 5:34 PM   Virtual Visit via Video Note   I, Loa Lamp, connected with  Gabriela [name]  ([id number], 06-Jul-1970) on 06/02/24 at  5:30 PM EST by a video-enabled telemedicine application and verified that I am speaking with the correct person using two identifiers.  Location: Patient: Virtual Visit Location  Patient: Home Provider: Virtual Visit Location Provider: Home Office   I discussed the limitations of evaluation and management by telemedicine and the availability of in person appointments. The patient expressed understanding and agreed to proceed.    History of Present Illness: Gabriela [name] is a 53 y.o. who identifies as a female who was assigned female at birth, and is being seen today for cough, sinus pressure and pain, body aches, sick last month with no treatment, no wheezing or sob, no fever, cough worse at night, dry cough. Sx for a month that improved then worsened again.   HPI: HPI  Problems:  Patient Active Problem List   Diagnosis Date Noted   Moderate mixed hyperlipidemia not requiring statin therapy 03/07/2024   Chronic pelvic pain in female 03/07/2024   Infection of toenail 10/31/2023   Constipation 10/31/2023   Encounter for general adult medical examination with abnormal findings 10/31/2023   Vitreous floaters of both eyes 05/08/2023   Numbness of leg 05/08/2023   Pre-diabetes 05/08/2023   LVH (left ventricular hypertrophy) 05/07/2023   Genetic testing 11/25/2015   HX: breast cancer 09/09/2015    Allergies:  Allergies  Allergen Reactions   Sulfa Antibiotics Other (See Comments)    Sulfa drugs - Discomfort   Medications:  Current Outpatient Medications:    azithromycin  (ZITHROMAX ) 250 MG tablet, Take 2 tablets on day 1, then 1 tablet daily on days 2 through 5, Disp: 6 tablet, Rfl: 0   promethazine -dextromethorphan (PROMETHAZINE -DM) 6.25-15 MG/5ML syrup, Take 5  mLs by mouth 4 (four) times daily as needed for up to 10 days for cough., Disp: 118 mL, Rfl: 0   ALPRAZolam (XANAX) 0.5 MG tablet, TAKE 1 TABLET(0.5 MG) BY MOUTH THREE TIMES DAILY AS NEEDED, Disp: , Rfl:    bacitracin -polymyxin b , ophth, (POLYSPORIN ) OINT, Place 1 Application into the right eye every 12 (twelve) hours., Disp: 3.5 g, Rfl: 0   Cyanocobalamin  (VITAMIN B-12 PO), Take by mouth., Disp: ,  Rfl:    escitalopram  (LEXAPRO ) 5 MG tablet, Take 1 tablet (5 mg total) by mouth daily., Disp: 90 tablet, Rfl: 3   hydrOXYzine (ATARAX) 10 MG tablet, Take 10 mg by mouth at bedtime., Disp: , Rfl:    lubiprostone  (AMITIZA ) 8 MCG capsule, Take 1 capsule (8 mcg total) by mouth 2 (two) times daily with a meal., Disp: 180 capsule, Rfl: 3   MAGNESIUM PO, Take by mouth., Disp: , Rfl:    Omega-3 Fatty Acids (OMEGA 3 PO), Take by mouth., Disp: , Rfl:    VITAMIN D  PO, Take 5,000 Int'l Units by mouth., Disp: , Rfl:   Observations/Objective: Patient is well-developed, well-nourished in no acute distress.  Resting comfortably  at home.  Head is normocephalic, atraumatic.  No labored breathing.  Speech is clear and coherent with logical content.  Patient is alert and oriented at baseline.    Assessment and Plan: 1. Acute bacterial sinusitis (Primary)  2. Bronchitis  Increase fluids, humidifier at night, tylenol  or ibuprofen as directed, UC if sx worsen.   Follow Up Instructions: I discussed the assessment and treatment plan with the patient. The patient was provided an opportunity to ask questions and all were answered. The patient agreed with the plan and demonstrated an understanding of the instructions.  A copy of instructions were sent to the patient via MyChart unless otherwise noted below.     The patient was advised to call back or seek an in-person evaluation if the symptoms worsen or if the condition fails to improve as anticipated.    Claiborne Stroble, FNP

## 2024-06-03 ENCOUNTER — Encounter: Payer: Self-pay | Admitting: Emergency Medicine

## 2024-06-03 ENCOUNTER — Ambulatory Visit: Admitting: Internal Medicine

## 2024-06-03 ENCOUNTER — Ambulatory Visit: Admitting: Emergency Medicine

## 2024-06-03 VITALS — BP 118/68 | HR 76 | Temp 98.5°F | Ht 67.25 in | Wt 136.0 lb

## 2024-06-03 DIAGNOSIS — U071 COVID-19: Secondary | ICD-10-CM | POA: Insufficient documentation

## 2024-06-03 DIAGNOSIS — R051 Acute cough: Secondary | ICD-10-CM | POA: Insufficient documentation

## 2024-06-03 DIAGNOSIS — R6889 Other general symptoms and signs: Secondary | ICD-10-CM | POA: Diagnosis not present

## 2024-06-03 LAB — POCT INFLUENZA A/B
Influenza A, POC: NEGATIVE
Influenza B, POC: NEGATIVE

## 2024-06-03 LAB — POC COVID19 BINAXNOW: SARS Coronavirus 2 Ag: POSITIVE — AB

## 2024-06-03 MED ORDER — NIRMATRELVIR/RITONAVIR (PAXLOVID)TABLET: 3.0000 | ORAL_TABLET | Freq: Two times a day (BID) | ORAL | 0 refills | Status: AC

## 2024-06-03 MED ORDER — HYDROCODONE BIT-HOMATROP MBR 5-1.5 MG/5ML PO SOLN: 5.0000 mL | Freq: Four times a day (QID) | ORAL | 0 refills | Status: AC | PRN

## 2024-06-03 NOTE — Telephone Encounter (Signed)
 Pt has appt this afternoon with D. Sagardia.

## 2024-06-03 NOTE — Assessment & Plan Note (Signed)
 Symptom management discussed Advised to rest and stay well-hydrated

## 2024-06-03 NOTE — Patient Instructions (Signed)
 COVID-19: What to Know COVID-19 is an infection caused by a virus called SARS-CoV-2. This type of virus is called a coronavirus. People with COVID-19 may: Have few to no symptoms. Have mild to moderate symptoms that affect their lungs and breathing. Get very sick. What are the causes?  COVID-19 is caused by a virus. This virus may be in the air as droplets or on surfaces. It can spread from an infected person when they cough, sneeze, speak, sing, or breathe. You may become infected if: You breathe in the infected droplets in the air. You touch an object that has the virus on it. What increases the risk? You are at risk of getting COVID-19 if you have been around someone with the infection. You may be more likely to get very sick if: You are 54 years old or older. You have certain medical conditions, such as: Heart disease. Diabetes. Long-term respiratory disease. Cancer. Pregnancy. You are immunocompromised. This means your body can't fight infections easily. You have a disability that makes it hard for you to move around, you have trouble moving, or you can't move at all. What are the signs or symptoms? People may have different symptoms from COVID-19. The symptoms can also be mild to very bad. They often show up in 5-6 days after being infected. But, they can take up to 14 days to appear. Common symptoms are: Cough. Feeling tired. New loss of taste or smell. Fever. Less common symptoms are: Sore throat. Headache. Body or muscle aches. Diarrhea. A skin rash or fingers or toes that are a different color than usual. Red or irritated eyes. Sometimes, COVID-19 does not cause symptoms. How is this diagnosed? COVID-19 can be diagnosed with tests done in the lab or at home. Fluid from your nose, mouth, or lungs will be used to check for the virus. How is this treated? Treatment for COVID-19 depends on how sick you are. Mild symptoms can be treated at home with rest, fluids, and  over-the-counter medicines. very bad symptoms may be treated in a hospital intensive care unit (ICU). If you have symptoms and are at risk of getting very sick, you may be given a medicine that fights viruses. This medicine is called an antiviral. How is this prevented? To protect yourself from COVID-19: Know your risk factors. Get vaccinated. If your body can't fight infections easily, talk to your provider about treatment to help prevent COVID-19. Stay at least about 3 feet (1 meter) away from other people. Wear mask that fits well when: You can't stay at a distance from people. You're in a place with not a lot of air flow. Try to be in open spaces with good air flow when you are in public. Wash your hands often or use an alcohol-based hand sanitizer. Cover your nose and mouth when you cough or sneeze. If you think you have COVID-19 or have been around someone who has it, stay home and away from other people as told by your provider or health officials. Where to find more information To learn more: Go to TonerPromos.no Click Health Topics. Type COVID-19 in the search box. Go to VisitDestination.com.br Click Health Topics. Then click All Topics. Type COVID-19 in the search box. Get help right away if: You have trouble breathing or get short of breath. You have pain or pressure in your chest. You're feeling confused. These symptoms may be an emergency. Get help right away. Call 911. Do not wait to see if the symptoms will go away.  Do not drive yourself to the hospital. This information is not intended to replace advice given to you by your health care provider. Make sure you discuss any questions you have with your health care provider. Document Revised: 03/15/2023 Document Reviewed: 03/07/2023 Elsevier Patient Education  2025 [employer].

## 2024-06-03 NOTE — Progress Notes (Signed)
 Gabriela [name] 53 y.o.   Chief Complaint  Patient presents with   Cough    cough,congestion,headache,body aches,sore throat,possible covid Pt has been traveling and she has been around someone who just tested positive today for Covid and want to be tested as well / pt states that she says the cough syrup and it helps with body aches     HISTORY OF PRESENT ILLNESS: This is a 53 y.o. female complaining of flulike symptoms that started about 3 days ago Recent traveling.  Exposure to COVID Mostly complaining of persisting cough and generalized achiness No other complaints or medical concerns today.  Cough Associated symptoms include headaches and myalgias. Pertinent negatives include no chest pain.     Prior to Admission medications   Medication Sig Start Date End Date Taking? Authorizing Provider  ALPRAZolam (XANAX) 0.5 MG tablet TAKE 1 TABLET(0.5 MG) BY MOUTH THREE TIMES DAILY AS NEEDED 11/28/22   [provider]  azithromycin  (ZITHROMAX ) 250 MG tablet Take 2 tablets on day 1, then 1 tablet daily on days 2 through 5 06/02/24 06/07/24  Blair, Diane W, FNP  bacitracin -polymyxin b , ophth, (POLYSPORIN ) OINT Place 1 Application into the right eye every 12 (twelve) hours. 07/16/23   Rising, Rebecca, PA-C  Cyanocobalamin  (VITAMIN B-12 PO) Take by mouth.    [provider]  escitalopram  (LEXAPRO ) 5 MG tablet Take 1 tablet (5 mg total) by mouth daily. 03/07/24   Rollene Gabriela LABOR, MD  hydrOXYzine (ATARAX) 10 MG tablet Take 10 mg by mouth at bedtime.    [provider]  lubiprostone  (AMITIZA ) 8 MCG capsule Take 1 capsule (8 mcg total) by mouth 2 (two) times daily with a meal. 10/30/23   Rollene Gabriela LABOR, MD  MAGNESIUM PO Take by mouth.    [provider]  Omega-3 Fatty Acids (OMEGA 3 PO) Take by mouth.    [provider]  promethazine -dextromethorphan (PROMETHAZINE -DM) 6.25-15 MG/5ML syrup Take 5 mLs by mouth 4 (four) times daily as needed for  up to 10 days for cough. 06/02/24 06/12/24  Blair, Diane W, FNP  VITAMIN D  PO Take 5,000 Int'l Units by mouth.    [provider]    Allergies  Allergen Reactions   Sulfa Antibiotics Other (See Comments)    Sulfa drugs - Discomfort    Patient Active Problem List   Diagnosis Date Noted   Moderate mixed hyperlipidemia not requiring statin therapy 03/07/2024   Chronic pelvic pain in female 03/07/2024   Infection of toenail 10/31/2023   Constipation 10/31/2023   Encounter for general adult medical examination with abnormal findings 10/31/2023   Vitreous floaters of both eyes 05/08/2023   Numbness of leg 05/08/2023   Pre-diabetes 05/08/2023   LVH (left ventricular hypertrophy) 05/07/2023   Genetic testing 11/25/2015   HX: breast cancer 09/09/2015    Past Medical History:  Diagnosis Date   Anemia    mild (09/23/2015)   Anxiety    situational   Breast cancer of upper-inner quadrant of left female breast (HCC) 09/09/2015   Chronic left shoulder pain    Constipation    Depression    Family history of breast cancer    Hemorrhoids    IBS (irritable bowel syndrome)    IC (interstitial cystitis)    Levator syndrome    levator ani syndrome   Neuromuscular disorder (HCC)    nerve pain under left arm    Past Surgical History:  Procedure Laterality Date   BREAST SURGERY Left 2017  MASTECTOMY COMPLETE / SIMPLE Right 09/23/2015   PROPHYLACTIC    MASTECTOMY COMPLETE / SIMPLE W/ SENTINEL NODE BIOPSY Left 09/23/2015   AXILLARY SENTINEL LYMPH NODE BIOPSY   MASTECTOMY W/ SENTINEL NODE BIOPSY Bilateral 09/23/2015   Procedure: LEFT TOTAL MASTECTOMY WITH LEFT AXILLARY SENTINEL LYMPH NODE BIOPSY, RIGHT PROPHYLACTIC TOTAL MASTECTOMY;  Surgeon: Donnice Bury, MD;  Location: [facility] OR;  Service: General;  Laterality: Bilateral;   RE-EXCISION OF BREAST CANCER,SUPERIOR MARGINS N/A 10/18/2015   Procedure: EXCISION OF SKIN FOR CLOSE MARGIN--ANTERIOR MARGIN;  Surgeon: Donnice Bury, MD;   Location: [facility];  Service: General;  Laterality: N/A;   TONSILLECTOMY     WISDOM TOOTH EXTRACTION      Social History   Socioeconomic History   Marital status: Single    Spouse name: Not on file   Number of children: 2   Years of education: Not on file   Highest education level: Not on file  Occupational History   Not on file  Tobacco Use   Smoking status: Never   Smokeless tobacco: Never  Substance and Sexual Activity   Alcohol use: Not Currently   Drug use: No   Sexual activity: Not Currently    Partners: Male    Birth control/protection: Post-menopausal  Other Topics Concern   Not on file  Social History Narrative   ** Merged History Encounter **       Social Drivers of Health   Financial Resource Strain: Low Risk  (04/26/2021)   Received from [facility] visits prior to 08/26/2022.   Overall Financial Resource Strain (CARDIA)    Difficulty of Paying Living Expenses: Not hard at all  Food Insecurity: No Food Insecurity (10/26/2023)   Received from [facility]   Hunger Vital Sign    Within the past 12 months, you worried that your food would run out before you got the money to buy more.: Never true    Within the past 12 months, the food you bought just didn't last and you didn't have money to get more.: Never true  Transportation Needs: No Transportation Needs (10/26/2023)   Received from [facility] - Transportation    In the past 12 months, has lack of transportation kept you from medical appointments or from getting medications?: No    Lack of Transportation (Non-Medical): No  Physical Activity: Sufficiently Active (04/26/2021)   Received from [facility] visits prior to 08/26/2022.   Exercise Vital Sign    On average, how many days per week do you engage in moderate to strenuous exercise (like a brisk walk)?: 4 days    On average, how many minutes do you engage  in exercise at this level?: 40 min  Stress: Stress Concern Present (04/26/2021)   Received from [facility] visits prior to 08/26/2022.   [employer] of Occupational Health - Occupational Stress Questionnaire    Feeling of Stress : To some extent  Social Connections: Moderately Integrated (04/26/2021)   Received from [facility] visits prior to 08/26/2022.   Social Connection and Isolation Panel    In a typical week, how many times do you talk on the phone with family, friends, or neighbors?: More than three times a week    How often do you get together with friends or relatives?: More than three times a week    How often do you attend church or religious services?:  More than 4 times per year    Do you belong to any clubs or organizations such as church groups, unions, fraternal or athletic groups, or school groups?: Yes    How often do you attend meetings of the clubs or organizations you belong to?: More than 4 times per year    Are you married, widowed, divorced, separated, never married, or living with a partner?: Divorced  Intimate Partner Violence: Not At Risk (04/26/2021)   Received from [facility] visits prior to 08/26/2022.   Humiliation, Afraid, Rape, and Kick questionnaire    Within the last year, have you been afraid of your partner or ex-partner?: No    Within the last year, have you been humiliated or emotionally abused in other ways by your partner or ex-partner?: No    Within the last year, have you been kicked, hit, slapped, or otherwise physically hurt by your partner or ex-partner?: No    Within the last year, have you been raped or forced to have any kind of sexual activity by your partner or ex-partner?: No    Family History  Problem Relation Age of Onset   Breast cancer Mother 45   Breast cancer Maternal Grandmother 27   Congestive Heart Failure Maternal Grandmother    Lung cancer Maternal Grandfather       Review of Systems  Constitutional:  Positive for malaise/fatigue.  HENT:  Positive for congestion.   Respiratory:  Positive for cough.   Cardiovascular: Negative.  Negative for chest pain and palpitations.  Gastrointestinal:  Negative for abdominal pain, diarrhea, nausea and vomiting.  Genitourinary: Negative.  Negative for dysuria and hematuria.  Musculoskeletal:  Positive for myalgias.  Neurological:  Positive for headaches.  All other systems reviewed and are negative.   Vitals:   06/03/24 1352  BP: 118/68  Pulse: 76  Temp: 98.5 F (36.9 C)  SpO2: 93%    Physical Exam Vitals reviewed.  Constitutional:      Appearance: Normal appearance.  HENT:     Head: Normocephalic.     Mouth/Throat:     Mouth: Mucous membranes are moist.     Pharynx: Oropharynx is clear.  Eyes:     Extraocular Movements: Extraocular movements intact.  Cardiovascular:     Rate and Rhythm: Normal rate and regular rhythm.     Pulses: Normal pulses.     Heart sounds: Normal heart sounds.  Pulmonary:     Effort: Pulmonary effort is normal.     Breath sounds: Normal breath sounds.  Musculoskeletal:     Cervical back: No tenderness.  Lymphadenopathy:     Cervical: No cervical adenopathy.  Skin:    General: Skin is warm and dry.  Neurological:     Mental Status: She is alert and oriented to person, place, and time.  Psychiatric:        Mood and Affect: Mood normal.        Behavior: Behavior normal.    Results for orders placed or performed in visit on 06/03/24 (from the past 24 hours)  POC COVID-19     Status: Abnormal   Collection Time: 06/03/24  2:08 PM  Result Value Ref Range   SARS Coronavirus 2 Ag Positive (A) Negative  POCT Influenza A/B     Status: Normal   Collection Time: 06/03/24  2:08 PM  Result Value Ref Range   Influenza A, POC Negative Negative   Influenza B, POC Negative Negative     ASSESSMENT &  PLAN: Problem List Items Addressed This Visit       Other    COVID-19 virus infection - Primary   Clinically stable.  No signs of pneumonia. Recommend to start Paxlovid  twice a day for 5 days Normal renal function Advised to rest and stay well-hydrated Symptom management discussed ED precautions given Advised to contact the office if no better or worse during the next several days.      Relevant Medications   nirmatrelvir /ritonavir  (PAXLOVID ) 20 x 150 MG & 10 x 100MG  TABS   Acute cough   Cough management discussed Advised to rest and stay well-hydrated Recommend over-the-counter Mucinex DM and cough drops Also use Hydromet cough syrup as needed      Relevant Medications   HYDROcodone  bit-homatropine (HYDROMET) 5-1.5 MG/5ML syrup   Other Relevant Orders   POC COVID-19 (Completed)   Flu-like symptoms   Symptom management discussed Advised to rest and stay well-hydrated      Relevant Orders   POCT Influenza A/B (Completed)   Patient Instructions  COVID-19: What to Know COVID-19 is an infection caused by a virus called SARS-CoV-2. This type of virus is called a coronavirus. People with COVID-19 may: Have few to no symptoms. Have mild to moderate symptoms that affect their lungs and breathing. Get very sick. What are the causes?  COVID-19 is caused by a virus. This virus may be in the air as droplets or on surfaces. It can spread from an infected person when they cough, sneeze, speak, sing, or breathe. You may become infected if: You breathe in the infected droplets in the air. You touch an object that has the virus on it. What increases the risk? You are at risk of getting COVID-19 if you have been around someone with the infection. You may be more likely to get very sick if: You are 58 years old or older. You have certain medical conditions, such as: Heart disease. Diabetes. Long-term respiratory disease. Cancer. Pregnancy. You are immunocompromised. This means your body can't fight infections easily. You have a disability  that makes it hard for you to move around, you have trouble moving, or you can't move at all. What are the signs or symptoms? People may have different symptoms from COVID-19. The symptoms can also be mild to very bad. They often show up in 5-6 days after being infected. But, they can take up to 14 days to appear. Common symptoms are: Cough. Feeling tired. New loss of taste or smell. Fever. Less common symptoms are: Sore throat. Headache. Body or muscle aches. Diarrhea. A skin rash or fingers or toes that are a different color than usual. Red or irritated eyes. Sometimes, COVID-19 does not cause symptoms. How is this diagnosed? COVID-19 can be diagnosed with tests done in the lab or at home. Fluid from your nose, mouth, or lungs will be used to check for the virus. How is this treated? Treatment for COVID-19 depends on how sick you are. Mild symptoms can be treated at home with rest, fluids, and over-the-counter medicines. very bad symptoms may be treated in a hospital intensive care unit (ICU). If you have symptoms and are at risk of getting very sick, you may be given a medicine that fights viruses. This medicine is called an antiviral. How is this prevented? To protect yourself from COVID-19: Know your risk factors. Get vaccinated. If your body can't fight infections easily, talk to your provider about treatment to help prevent COVID-19. Stay at least about 3 feet (1  meter) away from other people. Wear mask that fits well when: You can't stay at a distance from people. You're in a place with not a lot of air flow. Try to be in open spaces with good air flow when you are in public. Wash your hands often or use an alcohol-based hand sanitizer. Cover your nose and mouth when you cough or sneeze. If you think you have COVID-19 or have been around someone who has it, stay home and away from other people as told by your provider or health officials. Where to find more information To  learn more: Go to tonerpromos.no Click Health Topics. Type COVID-19 in the search box. Go to visitdestination.com.br Click Health Topics. Then click All Topics. Type COVID-19 in the search box. Get help right away if: You have trouble breathing or get short of breath. You have pain or pressure in your chest. You're feeling confused. These symptoms may be an emergency. Get help right away. Call 911. Do not wait to see if the symptoms will go away. Do not drive yourself to the hospital. This information is not intended to replace advice given to you by your health care provider. Make sure you discuss any questions you have with your health care provider. Document Revised: 03/15/2023 Document Reviewed: 03/07/2023 Elsevier Patient Education  2025 [employer] Inc[employer]     Emil Schaumann, MD [facility]

## 2024-06-03 NOTE — Assessment & Plan Note (Signed)
 Cough management discussed Advised to rest and stay well-hydrated Recommend over-the-counter Mucinex DM and cough drops Also use Hydromet cough syrup as needed

## 2024-06-03 NOTE — Assessment & Plan Note (Signed)
 Clinically stable.  No signs of pneumonia. Recommend to start Paxlovid  twice a day for 5 days Normal renal function Advised to rest and stay well-hydrated Symptom management discussed ED precautions given Advised to contact the office if no better or worse during the next several days.

## 2024-06-05 ENCOUNTER — Ambulatory Visit: Payer: Self-pay | Admitting: Internal Medicine

## 2024-06-05 NOTE — Telephone Encounter (Signed)
 FYI Only or Action Required?: Action required by provider: clinical question for provider and update on patient condition.  Patient was last seen in primary care on 06/03/2024 by Purcell Emil Schanz, MD.  Called Nurse Triage reporting Medication Problem and Nausea.  Symptoms began several days ago.  Interventions attempted: Rest, hydration, or home remedies.  Symptoms are: unchanged.  Triage Disposition: Call PCP Within 24 Hours  Patient/caregiver understands and will follow disposition?:   Copied from CRM #[id number]. Topic: Clinical - Red Word Triage >> Jun 05, 2024  9:59 AM Tinnie BROCKS wrote: Red Word that prompted transfer to Nurse Triage:  Requesting call back at #[phone]. Was seen recently for this problem so does not want appt. Was prescribed paxlovid  and something for pain for covid. She is very nauseous after taking paxlovid , wonders if something can also be called in for nausea. She is also wondering if she should get albuterol. States she is having lung pain, uncomfortable hollow feeling in lungs. Pharmacy confirmed: [id number] - [place], [place] - [address] AT [facility] OF [address] [place] [address] Phone: [phone] Fax: [phone] Reason for Disposition  Taking prescription medication that could cause nausea (e.g., narcotics/opiates, antibiotics, OCPs, many others)  Answer Assessment - Initial Assessment Questions Pt calling in stating that she has felt nauseated since taking paxlovid ; reports having COVID before and not having nausea. Pt denies any vomiting and is able to take in fluids/food. Pt requesting anti-emetic. Pt also requesting albuterol inhaler. Pt denies SOB, chest tightness or difficulty breathing; pt states her lungs feel hallow. States the person she was traveling with who also has covid now was given an albuterol inhaler and symptoms of hollowness subsided. Pt did not want appt, requested  medications be sent to [facility]. Please advise.    1. NAUSEA SEVERITY: How bad is the nausea? (e.g., mild, moderate, severe; dehydration, weight loss)     Moderate; states that she is not having vomiting but just feels generalized nausea that she did not have before taking paxlovid    2. ONSET: When did the nausea begin?     After taking paxlovid , 12/09  3. VOMITING: Any vomiting? If Yes, ask: How many times today?     None   4. RECURRENT SYMPTOM: Have you had nausea before? If Yes, ask: When was the last time? What happened that time?     No   5. CAUSE: What do you think is causing the nausea?     Medication and COVID  Protocols used: Nausea-A-AH

## 2024-06-05 NOTE — Telephone Encounter (Signed)
 1st attempt at calling patient to triage---no answer--left a voicemail       Copied from CRM [id number]. Topic: Clinical - Red Word Triage >> Jun 05, 2024  9:59 AM Tinnie BROCKS wrote: Red Word that prompted transfer to Nurse Triage:  Requesting call back at #[phone]. Was seen recently for this problem so does not want appt. Was prescribed paxlovid  and something for pain for covid. She is very nauseous after taking paxlovid , wonders if something can also be called in for nausea. She is also wondering if she should get albuterol. States she is having lung pain, uncomfortable hollow feeling in lungs. Pharmacy confirmed: [id number] [place], [place] - [address] AT [facility] OF [address] [place]  [address] Phone: [phone] Fax: [phone]

## 2024-06-06 ENCOUNTER — Telehealth: Payer: Self-pay

## 2024-06-06 ENCOUNTER — Encounter: Payer: Self-pay | Admitting: Family Medicine

## 2024-06-06 ENCOUNTER — Telehealth: Payer: Self-pay | Admitting: Family Medicine

## 2024-06-06 ENCOUNTER — Ambulatory Visit: Payer: Self-pay

## 2024-06-06 ENCOUNTER — Encounter: Payer: Self-pay | Admitting: Emergency Medicine

## 2024-06-06 ENCOUNTER — Ambulatory Visit: Payer: Self-pay | Admitting: Internal Medicine

## 2024-06-06 DIAGNOSIS — R051 Acute cough: Secondary | ICD-10-CM

## 2024-06-06 MED ORDER — ONDANSETRON HCL 4 MG PO TABS: 4.0000 mg | ORAL_TABLET | Freq: Three times a day (TID) | ORAL | 0 refills | Status: AC | PRN

## 2024-06-06 NOTE — Telephone Encounter (Signed)
 FYI Only or Action Required?: Action required by provider: Requesting anti nausea and albuterol inhaler.  Patient was last seen in primary care on 06/03/2024 by Purcell Emil Schanz, MD.  Called Nurse Triage reporting Nausea.  Symptoms began several days ago.  Interventions attempted: Nothing.  Symptoms are: stable.  Triage Disposition: No disposition on file.  Patient/caregiver understands and will follow disposition?:     Reason for Disposition  Taking prescription medication that could cause nausea (e.g., narcotics/opiates, antibiotics, OCPs, many others)  Answer Assessment - Initial Assessment Questions Patient reports calling in yesterday and requesting anti nausea medication and has not heard anything. Patient is also asking about Albuterol inhaler because her of chest congestion and lungs. States SOB is not significant, not hospital worthy, just really sick  denies asthma or other lung conditions. Patient is requesting to hear something back by end of day today as she has called yesterday and now again today. Please advise.   1. NAUSEA SEVERITY: How bad is the nausea? (e.g., mild, moderate, severe; dehydration, weight loss)     Severe  2. ONSET: When did the nausea begin?     2-3 days ago  3. VOMITING: Any vomiting? If Yes, ask: How many times today?     Denies  4. RECURRENT SYMPTOM: Have you had nausea before? If Yes, ask: When was the last time? What happened that time?     Denies  5. CAUSE: What do you think is causing the nausea?     Paxlovid   Protocols used: Nausea-A-AH  Message from [place] [facility] sent at 06/06/2024  3:36 PM EST  Summary: nausea   Reason for Triage:nausea from covid medication

## 2024-06-06 NOTE — Telephone Encounter (Signed)
 E[facility] called indicating pt had sent multiple messages/requests for nausea medication due to current COVID illness and her use of Paxlovid .  She is taking promethazine  cough syrup w/o relief of nausea.  Will send Zofran .  Also asked E2C[facility] if she could use albuterol for COVID.  I told the nurse that there is no contraindication to using Albuterol but since this is not on her med list, any SOB or wheezing in the setting of COVID would require re-evaluation.  Pt denied SOB or wheezing at this time.

## 2024-06-06 NOTE — Telephone Encounter (Signed)
 Comer Greet, MD (On Call)- Triage RN called provider at [phone], left message requesting call back.   Spoke with Dr. Greet at [phone] upon her callback, she reports will send over prescription for Zofran  4 mg to take as needed for nausea.  Will send to [facility] #[id number].  If patient is having any trouble breathing, shortness of breath needs to be re-evaluated to determine need for albuterol.   Called patient back and made her aware prescription being sent over to [facility], verified location.  Patient clarifies she does not have an albuterol prescription, but wondered about this as her friend was given albuterol when also given Paxlovid .   States understanding of need to be re-evaluated if any trouble breathing.   FYI Only or Action Required?: Action required by provider: clinical question for provider.  Patient attempted to contact multiple times, no response. Having nausea relates to Paxlovid .  Requesting anti-nausea medication and also inquires if albuterol can be used to treat COVID.  Denies shortness of breath.   Patient was last seen in primary care on 06/03/2024 by Purcell Emil Schanz, MD.  Called Nurse Triage reporting Nausea.  Symptoms began with COVID several days ago. Nausea onset with Paxlovid .   Interventions attempted: Prescription medications: Paxlovid  and Rest, hydration, or home remedies.  Symptoms are: stable.  Triage Disposition: Call PCP Within 24 Hours- patient previously attempted, no response.  Requests to speak with on-call.   Patient/caregiver understands and will follow disposition?: Yes      Copied from CRM #[id number]. Topic: Clinical - Medication Question >> Jun 06, 2024  4:53 PM Tonda B wrote: Reason for CRM: Patient called in for anti-nausea medication. She stated that the medication that she's taking is not working and she's on her 3rd dose for the day. Patient requesting a call back asap    Reason for Disposition  Taking prescription medication  that could cause nausea (e.g., narcotics/opiates, antibiotics, OCPs, many others)    Paxlovid   Additional Information  Negative: Difficulty breathing    Denies.  Inquires if albuterol could help to treat COVID.  Negative: [1] Insulin-dependent diabetes (Type I) AND [2] glucose > 400 mg/dL (22 mmol/L)    Watching diet  Answer Assessment - Initial Assessment Questions NAUSEA SEVERITY: How bad is the nausea? (e.g., mild, moderate, severe; dehydration, weight loss)     Moderate  ONSET: When did the nausea begin?     Was not having prior to starting medication.  Noticed nausea onset after starting Paxlovid  for COVID. Notes a delay after taking medication before symptom onset and relates to timed release of medication.  Has noted a weird taste in mouth intermittently as well not associated with nausea.   VOMITING: Any vomiting? If Yes, ask: How many times today?     Denies  CAUSE: What do you think is causing the nausea?     Paxlovid  medication.    Has been taking Paxlovid  two times daily and has not been prescribed anything for nausea.   Symptoms started Friday, saw [facility] Monday and was prescribed Zpak.  Tested for COVID Tuesday when seen and was positive.  Stopped Zpak and started Paxlovid  on Tuesday.   Also inquires about albuterol inhaler use for COVID and has ongoing cough.  Protocols used: Nausea-A-AH

## 2024-06-06 NOTE — Telephone Encounter (Signed)
 Copied from CRM #[id number]. Topic: Clinical - Medication Question >> Jun 06, 2024  3:21 PM Gabriela [name] wrote: Reason for CRM: Patient called in for anti-nausea medication. She stated that the medication that she's taking is not working and she's on her 3rd dose for the day. Patient requesting a call back asap

## 2024-06-07 ENCOUNTER — Telehealth

## 2024-06-09 ENCOUNTER — Ambulatory Visit: Admitting: Internal Medicine

## 2024-06-09 NOTE — Telephone Encounter (Signed)
 This message is from 06/06/2024.  Call patient and find out if she is still nauseous and how she is doing in general.

## 2024-06-09 NOTE — Telephone Encounter (Signed)
 LVM for pt to call office

## 2024-06-09 NOTE — Telephone Encounter (Signed)
 Called patient and LVM 1 attempt

## 2024-06-09 NOTE — Telephone Encounter (Signed)
 Please advise

## 2024-06-10 NOTE — Telephone Encounter (Signed)
Issue has been addressed.

## 2024-07-09 ENCOUNTER — Other Ambulatory Visit: Payer: Self-pay | Admitting: *Deleted

## 2024-07-28 ENCOUNTER — Ambulatory Visit: Admitting: Internal Medicine

## 2024-08-06 ENCOUNTER — Ambulatory Visit: Admitting: Internal Medicine
